# Patient Record
Sex: Male | Born: 1955
Health system: Southern US, Community
[De-identification: ages and names within clinical notes are randomized; demographics above are authoritative.]

## PROBLEM LIST (undated history)

## (undated) DIAGNOSIS — I509 Heart failure, unspecified: Secondary | ICD-10-CM

## (undated) DIAGNOSIS — E119 Type 2 diabetes mellitus without complications: Secondary | ICD-10-CM

## (undated) DIAGNOSIS — I10 Essential (primary) hypertension: Secondary | ICD-10-CM

## (undated) HISTORY — PX: CORONARY ARTERY BYPASS GRAFT: SHX141

## (undated) HISTORY — PX: APPENDECTOMY: SHX54

---

## 2006-07-25 ENCOUNTER — Inpatient Hospital Stay: Payer: Self-pay | Admitting: Internal Medicine

## 2006-07-25 ENCOUNTER — Other Ambulatory Visit: Payer: Self-pay

## 2008-04-22 ENCOUNTER — Inpatient Hospital Stay: Payer: Self-pay | Admitting: Specialist

## 2013-03-28 ENCOUNTER — Emergency Department: Payer: Self-pay | Admitting: Emergency Medicine

## 2013-03-28 LAB — CBC
HCT: 39.1 % — AB (ref 40.0–52.0)
HGB: 12.9 g/dL — AB (ref 13.0–18.0)
MCH: 29 pg (ref 26.0–34.0)
MCHC: 33.1 g/dL (ref 32.0–36.0)
MCV: 88 fL (ref 80–100)
Platelet: 233 10*3/uL (ref 150–440)
RBC: 4.46 10*6/uL (ref 4.40–5.90)
RDW: 13.4 % (ref 11.5–14.5)
WBC: 9.2 10*3/uL (ref 3.8–10.6)

## 2013-03-28 LAB — BASIC METABOLIC PANEL
Anion Gap: 6 — ABNORMAL LOW (ref 7–16)
BUN: 36 mg/dL — ABNORMAL HIGH (ref 7–18)
CREATININE: 1.39 mg/dL — AB (ref 0.60–1.30)
Calcium, Total: 9.4 mg/dL (ref 8.5–10.1)
Chloride: 102 mmol/L (ref 98–107)
Co2: 28 mmol/L (ref 21–32)
EGFR (African American): >60
EGFR (Non-African Amer.): 55 — ABNORMAL LOW
Glucose: 88 mg/dL (ref 65–99)
OSMOLALITY: 280 (ref 275–301)
Potassium: 4.1 mmol/L (ref 3.5–5.1)
Sodium: 136 mmol/L (ref 136–145)

## 2013-03-28 LAB — TROPONIN I: Troponin-I: <0.02 ng/mL

## 2013-04-09 ENCOUNTER — Ambulatory Visit: Payer: Self-pay | Admitting: Cardiology

## 2013-06-02 DIAGNOSIS — I5022 Chronic systolic (congestive) heart failure: Secondary | ICD-10-CM | POA: Insufficient documentation

## 2013-06-02 DIAGNOSIS — I10 Essential (primary) hypertension: Secondary | ICD-10-CM | POA: Insufficient documentation

## 2013-06-02 DIAGNOSIS — E119 Type 2 diabetes mellitus without complications: Secondary | ICD-10-CM | POA: Insufficient documentation

## 2013-06-02 DIAGNOSIS — I25810 Atherosclerosis of coronary artery bypass graft(s) without angina pectoris: Secondary | ICD-10-CM | POA: Insufficient documentation

## 2013-06-02 DIAGNOSIS — I2510 Atherosclerotic heart disease of native coronary artery without angina pectoris: Secondary | ICD-10-CM | POA: Insufficient documentation

## 2013-06-02 DIAGNOSIS — I152 Hypertension secondary to endocrine disorders: Secondary | ICD-10-CM | POA: Insufficient documentation

## 2013-12-24 DIAGNOSIS — I4510 Unspecified right bundle-branch block: Secondary | ICD-10-CM | POA: Insufficient documentation

## 2014-01-17 DIAGNOSIS — E119 Type 2 diabetes mellitus without complications: Secondary | ICD-10-CM | POA: Insufficient documentation

## 2015-02-05 DIAGNOSIS — M109 Gout, unspecified: Secondary | ICD-10-CM | POA: Insufficient documentation

## 2015-05-09 DIAGNOSIS — H5440 Blindness, one eye, unspecified eye: Secondary | ICD-10-CM | POA: Insufficient documentation

## 2015-08-15 DIAGNOSIS — E782 Mixed hyperlipidemia: Secondary | ICD-10-CM | POA: Insufficient documentation

## 2015-08-15 DIAGNOSIS — E1169 Type 2 diabetes mellitus with other specified complication: Secondary | ICD-10-CM | POA: Insufficient documentation

## 2018-04-11 ENCOUNTER — Other Ambulatory Visit: Payer: Self-pay | Admitting: Cardiology

## 2018-04-11 ENCOUNTER — Other Ambulatory Visit: Payer: Self-pay

## 2018-04-11 DIAGNOSIS — R0789 Other chest pain: Secondary | ICD-10-CM

## 2018-04-13 ENCOUNTER — Encounter (INDEPENDENT_AMBULATORY_CARE_PROVIDER_SITE_OTHER): Payer: Self-pay

## 2018-04-13 ENCOUNTER — Ambulatory Visit
Admission: RE | Admit: 2018-04-13 | Discharge: 2018-04-13 | Disposition: A | Discharge status: Home / Self Care | Payer: Commercial Managed Care - PPO | Source: Ambulatory Visit | Attending: Cardiology | Admitting: Cardiology

## 2018-04-13 DIAGNOSIS — R0789 Other chest pain: Secondary | ICD-10-CM | POA: Diagnosis present

## 2018-04-13 HISTORY — DX: Essential (primary) hypertension: I10

## 2018-04-13 HISTORY — DX: Heart failure, unspecified: I50.9

## 2018-04-13 MED ORDER — IOPAMIDOL (ISOVUE-370) INJECTION 76%
100.0000 mL | Freq: Once | INTRAVENOUS | Status: AC | PRN
  Administered 2018-04-13: 100 mL via INTRAVENOUS

## 2018-04-17 ENCOUNTER — Other Ambulatory Visit: Payer: Self-pay

## 2018-04-17 ENCOUNTER — Encounter
Admission: RE | Disposition: A | Discharge status: Home / Self Care | Payer: Self-pay | Source: Home / Self Care | Attending: Cardiology

## 2018-04-17 ENCOUNTER — Ambulatory Visit
Admission: RE | Admit: 2018-04-17 | Discharge: 2018-04-17 | Disposition: A | Discharge status: Home / Self Care | Payer: Commercial Managed Care - PPO | Attending: Cardiology | Admitting: Cardiology

## 2018-04-17 DIAGNOSIS — I25810 Atherosclerosis of coronary artery bypass graft(s) without angina pectoris: Secondary | ICD-10-CM | POA: Diagnosis not present

## 2018-04-17 DIAGNOSIS — Z87891 Personal history of nicotine dependence: Secondary | ICD-10-CM | POA: Insufficient documentation

## 2018-04-17 DIAGNOSIS — E785 Hyperlipidemia, unspecified: Secondary | ICD-10-CM | POA: Insufficient documentation

## 2018-04-17 DIAGNOSIS — I509 Heart failure, unspecified: Secondary | ICD-10-CM | POA: Diagnosis not present

## 2018-04-17 DIAGNOSIS — Z79899 Other long term (current) drug therapy: Secondary | ICD-10-CM | POA: Insufficient documentation

## 2018-04-17 DIAGNOSIS — Z7984 Long term (current) use of oral hypoglycemic drugs: Secondary | ICD-10-CM | POA: Insufficient documentation

## 2018-04-17 DIAGNOSIS — I4510 Unspecified right bundle-branch block: Secondary | ICD-10-CM | POA: Diagnosis not present

## 2018-04-17 DIAGNOSIS — I110 Hypertensive heart disease with heart failure: Secondary | ICD-10-CM | POA: Insufficient documentation

## 2018-04-17 DIAGNOSIS — Z8673 Personal history of transient ischemic attack (TIA), and cerebral infarction without residual deficits: Secondary | ICD-10-CM | POA: Diagnosis not present

## 2018-04-17 DIAGNOSIS — E119 Type 2 diabetes mellitus without complications: Secondary | ICD-10-CM | POA: Diagnosis not present

## 2018-04-17 DIAGNOSIS — Z7982 Long term (current) use of aspirin: Secondary | ICD-10-CM | POA: Insufficient documentation

## 2018-04-17 DIAGNOSIS — R079 Chest pain, unspecified: Secondary | ICD-10-CM | POA: Insufficient documentation

## 2018-04-17 DIAGNOSIS — M109 Gout, unspecified: Secondary | ICD-10-CM | POA: Diagnosis not present

## 2018-04-17 HISTORY — PX: LEFT HEART CATH AND CORS/GRAFTS ANGIOGRAPHY: CATH118250

## 2018-04-17 LAB — GLUCOSE, CAPILLARY: Glucose-Capillary: 141 mg/dL — ABNORMAL HIGH (ref 70–99)

## 2018-04-17 SURGERY — LEFT HEART CATH AND CORS/GRAFTS ANGIOGRAPHY
Anesthesia: Moderate Sedation

## 2018-04-17 MED ORDER — MIDAZOLAM HCL 2 MG/2ML IJ SOLN
INTRAMUSCULAR | Status: AC
  Filled 2018-04-17: qty 2

## 2018-04-17 MED ORDER — HEPARIN (PORCINE) IN NACL 1000-0.9 UT/500ML-% IV SOLN
INTRAVENOUS | Status: DC | PRN
  Administered 2018-04-17: 500 mL

## 2018-04-17 MED ORDER — ASPIRIN 81 MG PO CHEW
CHEWABLE_TABLET | ORAL | Status: AC
  Administered 2018-04-17: 81 mg via ORAL
  Filled 2018-04-17: qty 1

## 2018-04-17 MED ORDER — NITROGLYCERIN 2 % TD OINT
1.0000 [in_us] | TOPICAL_OINTMENT | Freq: Once | TRANSDERMAL | Status: AC
  Administered 2018-04-17: 1 [in_us] via TOPICAL

## 2018-04-17 MED ORDER — IOPAMIDOL (ISOVUE-300) INJECTION 61%
INTRAVENOUS | Status: DC | PRN
  Administered 2018-04-17: 65 mL via INTRA_ARTERIAL

## 2018-04-17 MED ORDER — HEPARIN (PORCINE) IN NACL 1000-0.9 UT/500ML-% IV SOLN
INTRAVENOUS | Status: AC
  Filled 2018-04-17: qty 1000

## 2018-04-17 MED ORDER — ASPIRIN 81 MG PO CHEW
81.0000 mg | CHEWABLE_TABLET | ORAL | Status: AC
  Administered 2018-04-17: 81 mg via ORAL

## 2018-04-17 MED ORDER — NITROGLYCERIN 0.4 MG SL SUBL
SUBLINGUAL_TABLET | SUBLINGUAL | Status: AC
  Filled 2018-04-17: qty 1

## 2018-04-17 MED ORDER — SODIUM CHLORIDE 0.9% FLUSH: 3.0000 mL | INTRAVENOUS | Status: DC | PRN

## 2018-04-17 MED ORDER — SODIUM CHLORIDE 0.9 % WEIGHT BASED INFUSION
3.0000 mL/kg/h | INTRAVENOUS | Status: AC
  Administered 2018-04-17: 1.495 mL/kg/h via INTRAVENOUS

## 2018-04-17 MED ORDER — FENTANYL CITRATE (PF) 100 MCG/2ML IJ SOLN
INTRAMUSCULAR | Status: AC
  Filled 2018-04-17: qty 2

## 2018-04-17 MED ORDER — SODIUM CHLORIDE 0.9 % IV SOLN: 250.0000 mL | INTRAVENOUS | Status: DC | PRN

## 2018-04-17 MED ORDER — NITROGLYCERIN 0.4 MG SL SUBL
0.4000 mg | SUBLINGUAL_TABLET | SUBLINGUAL | Status: DC | PRN
  Administered 2018-04-17: 0.4 mg via SUBLINGUAL

## 2018-04-17 MED ORDER — FENTANYL CITRATE (PF) 100 MCG/2ML IJ SOLN
INTRAMUSCULAR | Status: DC | PRN
  Administered 2018-04-17: 50 ug via INTRAVENOUS
  Administered 2018-04-17: 25 ug via INTRAVENOUS

## 2018-04-17 MED ORDER — MIDAZOLAM HCL 2 MG/2ML IJ SOLN
INTRAMUSCULAR | Status: DC | PRN
  Administered 2018-04-17 (×2): 1 mg via INTRAVENOUS

## 2018-04-17 MED ORDER — SODIUM CHLORIDE 0.9 % WEIGHT BASED INFUSION
1.0000 mL/kg/h | INTRAVENOUS | Status: DC
  Administered 2018-04-17: 1 mL/kg/h via INTRAVENOUS

## 2018-04-17 MED ORDER — SODIUM CHLORIDE 0.9% FLUSH: 3.0000 mL | Freq: Two times a day (BID) | INTRAVENOUS | Status: DC

## 2018-04-17 MED ORDER — NITROGLYCERIN 2 % TD OINT
TOPICAL_OINTMENT | TRANSDERMAL | Status: AC
  Administered 2018-04-17: 1 [in_us] via TOPICAL
  Filled 2018-04-17: qty 1

## 2018-04-17 SURGICAL SUPPLY — 11 items
CATH INFINITI 5FR ANG PIGTAIL (CATHETERS) ×4 IMPLANT
CATH INFINITI 5FR JL4 (CATHETERS) ×4 IMPLANT
CATH INFINITI JR4 5F (CATHETERS) ×4 IMPLANT
DEVICE CLOSURE MYNXGRIP 5F (Vascular Products) ×4 IMPLANT
GLIDESHEATH SLEND A-KIT 6F 22G (SHEATH) IMPLANT
KIT MANI 3VAL PERCEP (MISCELLANEOUS) ×4 IMPLANT
NEEDLE PERC 18GX7CM (NEEDLE) ×4 IMPLANT
PACK CARDIAC CATH (CUSTOM PROCEDURE TRAY) ×4 IMPLANT
SHEATH AVANTI 5FR X 11CM (SHEATH) ×4 IMPLANT
WIRE GUIDERIGHT .035X150 (WIRE) ×4 IMPLANT
WIRE ROSEN-J .035X260CM (WIRE) IMPLANT

## 2018-04-17 NOTE — Discharge Instructions (Signed)
Isosorbide Dinitrate, ISDN extended-release tablets or capsules What is this medicine? ISOSORBIDE DINITRATE (eye soe SOR bide dye NYE trate) is a type of vasodilator. It relaxes blood vessels, increasing the blood and oxygen supply to your heart. This medicine is used to prevent chest pain caused by angina. It will not help to stop an episode of chest pain. This medicine may be used for other purposes; ask your health care provider or pharmacist if you have questions. COMMON BRAND NAME(S): Dilatrate SR, Isochron, IsoDitrate What should I tell my health care provider before I take this medicine? They need to know if you have any of these conditions: -previous heart attack or heart failure -an unusual or allergic reaction to isosorbide dinitrate, nitrates, other medicines, foods, dyes, or preservatives -pregnant or trying to get pregnant -breast-feeding How should I use this medicine? Take this medicine by mouth with a glass of water. Follow the directions on the prescription label. Swallow whole. Do not crush or chew. Take your medicine at regular intervals. Do not take your medicine more often than directed. Do not stop taking this medicine suddenly or your symptoms may get worse. Ask your doctor or health care professional how to gradually reduce the dose. Talk to your pediatrician regarding the use of this medicine in children. Special care may be needed. Overdosage: If you think you have taken too much of this medicine contact a poison control center or emergency room at once. NOTE: This medicine is only for you. Do not share this medicine with others. What if I miss a dose? If you miss a dose, take it as soon as you can. If it is almost time for your next dose, take only that dose. Do not take double or extra doses. What may interact with this medicine? Do not take this medicine with any of the following medications: -medicines used to treat erectile dysfunction (ED) like avanafil,  sildenafil, tadalafil, and vardenafil -riociguat This medicine may also interact with the following medications: -medicines for high blood pressure -other medicines for angina or heart failure This list may not describe all possible interactions. Give your health care provider a list of all the medicines, herbs, non-prescription drugs, or dietary supplements you use. Also tell them if you smoke, drink alcohol, or use illegal drugs. Some items may interact with your medicine. What should I watch for while using this medicine? Check your heart rate and blood pressure regularly while you are taking this medicine. Ask your doctor or health care professional what your heart rate and blood pressure should be and when you should contact him or her. Tell your doctor or health care professional if you feel your medicine is no longer working. You may get dizzy. Do not drive, use machinery, or do anything that needs mental alertness until you know how this medicine affects you. To reduce the risk of dizzy or fainting spells, do not sit or stand up quickly, especially if you are an older patient. Alcohol can make you more dizzy, and increase flushing and rapid heartbeats. Avoid alcoholic drinks. Do not treat yourself for coughs, colds, or pain while you are taking this medicine without asking your doctor or health care professional for advice. Some ingredients may increase your blood pressure. What side effects may I notice from receiving this medicine? Side effects that you should report to your doctor or health care professional as soon as possible: -bluish discoloration of lips, fingernails, or palms of hands -irregular heartbeat, palpitations -low blood pressure -nausea, vomiting -  persistent headache -unusually weak or tired Side effects that usually do not require medical attention (report to your doctor or health care professional if they continue or are bothersome): -flushing of the face or  neck -rash This list may not describe all possible side effects. Call your doctor for medical advice about side effects. You may report side effects to FDA at 1-800-FDA-1088. Where should I keep my medicine? Keep out of the reach of children. Store at room temperature between 15 and 30 degrees C (59 and 86 degrees F). Protect from moisture. Throw away any unused medicine after the expiration date. NOTE: This sheet is a summary. It may not cover all possible information. If you have questions about this medicine, talk to your doctor, pharmacist, or health care provider.  2019 Elsevier/Gold Standard (2012-12-22 14:46:07) Angiogram, Care After This sheet gives you information about how to care for yourself after your procedure. Your doctor may also give you more specific instructions. If you have problems or questions, contact your doctor. Follow these instructions at home: Insertion site care  Follow instructions from your doctor about how to take care of your long, thin tube (catheter) insertion area. Make sure you: ? Wash your hands with soap and water before you change your bandage (dressing). If you cannot use soap and water, use hand sanitizer. ? Change your bandage as told by your doctor. ? Leave stitches (sutures), skin glue, or skin tape (adhesive) strips in place. They may need to stay in place for 2 weeks or longer. If tape strips get loose and curl up, you may trim the loose edges. Do not remove tape strips completely unless your doctor says it is okay.  Do not take baths, swim, or use a hot tub until your doctor says it is okay.  You may shower 24-48 hours after the procedure or as told by your doctor. ? Gently wash the area with plain soap and water. ? Pat the area dry with a clean towel. ? Do not rub the area. This may cause bleeding.  Do not apply powder or lotion to the area. Keep the area clean and dry.  Check your insertion area every day for signs of infection. Check  for: ? More redness, swelling, or pain. ? Fluid or blood. ? Warmth. ? Pus or a bad smell. Activity  Rest as told by your doctor, usually for 1-2 days.  Do not lift anything that is heavier than 10 lbs. (4.5 kg) or as told by your doctor.  Do not drive for 24 hours if you were given a medicine to help you relax (sedative).  Do not drive or use heavy machinery while taking prescription pain medicine. General instructions   Go back to your normal activities as told by your doctor, usually in about a week. Ask your doctor what activities are safe for you.  If the insertion area starts to bleed, lie flat and put pressure on the area. If the bleeding does not stop, get help right away. This is an emergency.  Drink enough fluid to keep your pee (urine) clear or pale yellow.  Take over-the-counter and prescription medicines only as told by your doctor.  Keep all follow-up visits as told by your doctor. This is important. Contact a doctor if:  You have a fever.  You have chills.  You have more redness, swelling, or pain around your insertion area.  You have fluid or blood coming from your insertion area.  The insertion area feels warm  to the touch.  You have pus or a bad smell coming from your insertion area.  You have more bruising around the insertion area.  Blood collects in the tissue around the insertion area (hematoma) that may be painful to the touch. Get help right away if:  You have a lot of pain in the insertion area.  The insertion area swells very fast.  The insertion area is bleeding, and the bleeding does not stop after holding steady pressure on the area.  The area near or just beyond the insertion area becomes pale, cool, tingly, or numb. These symptoms may be an emergency. Do not wait to see if the symptoms will go away. Get medical help right away. Call your local emergency services (911 in the [place] Do not drive yourself to the  hospital. Summary  After the procedure, it is common to have bruising and tenderness at the long, thin tube insertion area.  After the procedure, it is important to rest and drink plenty of fluids.  Do not take baths, swim, or use a hot tub until your doctor says it is okay to do so. You may shower 24-48 hours after the procedure or as told by your doctor.  If the insertion area starts to bleed, lie flat and put pressure on the area. If the bleeding does not stop, get help right away. This is an emergency. This information is not intended to replace advice given to you by your health care provider. Make sure you discuss any questions you have with your health care provider. Document Released: 05/21/2008 Document Revised: 02/17/2016 Document Reviewed: 02/17/2016 Elsevier Interactive Patient Education  2019 Elsevier Inc. Moderate Conscious Sedation, Adult, Care After These instructions provide you with information about caring for yourself after your procedure. Your health care provider may also give you more specific instructions. Your treatment has been planned according to current medical practices, but problems sometimes occur. Call your health care provider if you have any problems or questions after your procedure. What can I expect after the procedure? After your procedure, it is common:  To feel sleepy for several hours.  To feel clumsy and have poor balance for several hours.  To have poor judgment for several hours.  To vomit if you eat too soon. Follow these instructions at home: For at least 24 hours after the procedure:   Do not: ? Participate in activities where you could fall or become injured. ? Drive. ? Use heavy machinery. ? Drink alcohol. ? Take sleeping pills or medicines that cause drowsiness. ? Make important decisions or sign legal documents. ? Take care of children on your own.  Rest. Eating and drinking  Follow the diet recommended by your health care  provider.  If you vomit: ? Drink water, juice, or soup when you can drink without vomiting. ? Make sure you have little or no nausea before eating solid foods. General instructions  Have a responsible adult stay with you until you are awake and alert.  Take over-the-counter and prescription medicines only as told by your health care provider.  If you smoke, do not smoke without supervision.  Keep all follow-up visits as told by your health care provider. This is important. Contact a health care provider if:  You keep feeling nauseous or you keep vomiting.  You feel light-headed.  You develop a rash.  You have a fever. Get help right away if:  You have trouble breathing. This information is not intended to replace advice given  to you by your health care provider. Make sure you discuss any questions you have with your health care provider. Document Released: 12/13/2012 Document Revised: 07/28/2015 Document Reviewed: 06/14/2015 Elsevier Interactive Patient Education  2019 [employer]

## 2018-05-01 ENCOUNTER — Other Ambulatory Visit (INDEPENDENT_AMBULATORY_CARE_PROVIDER_SITE_OTHER): Payer: Self-pay | Admitting: Cardiology

## 2018-05-01 ENCOUNTER — Ambulatory Visit (INDEPENDENT_AMBULATORY_CARE_PROVIDER_SITE_OTHER): Payer: Commercial Managed Care - PPO

## 2018-05-01 DIAGNOSIS — I25810 Atherosclerosis of coronary artery bypass graft(s) without angina pectoris: Secondary | ICD-10-CM

## 2018-05-30 DIAGNOSIS — R079 Chest pain, unspecified: Secondary | ICD-10-CM | POA: Insufficient documentation

## 2018-11-28 ENCOUNTER — Ambulatory Visit (INDEPENDENT_AMBULATORY_CARE_PROVIDER_SITE_OTHER): Payer: Commercial Managed Care - PPO | Admitting: Vascular Surgery

## 2018-11-28 ENCOUNTER — Encounter (INDEPENDENT_AMBULATORY_CARE_PROVIDER_SITE_OTHER): Payer: Self-pay | Admitting: Vascular Surgery

## 2018-11-28 ENCOUNTER — Encounter (INDEPENDENT_AMBULATORY_CARE_PROVIDER_SITE_OTHER): Payer: Self-pay

## 2018-11-28 ENCOUNTER — Other Ambulatory Visit: Payer: Self-pay

## 2018-11-28 VITALS — BP 136/76 | HR 76 | Resp 14 | Ht 67.0 in | Wt 174.0 lb

## 2018-11-28 DIAGNOSIS — I25810 Atherosclerosis of coronary artery bypass graft(s) without angina pectoris: Secondary | ICD-10-CM

## 2018-11-28 DIAGNOSIS — I10 Essential (primary) hypertension: Secondary | ICD-10-CM | POA: Diagnosis not present

## 2018-11-28 DIAGNOSIS — E785 Hyperlipidemia, unspecified: Secondary | ICD-10-CM

## 2018-11-28 DIAGNOSIS — I70219 Atherosclerosis of native arteries of extremities with intermittent claudication, unspecified extremity: Secondary | ICD-10-CM | POA: Insufficient documentation

## 2018-11-28 DIAGNOSIS — I739 Peripheral vascular disease, unspecified: Secondary | ICD-10-CM | POA: Insufficient documentation

## 2018-11-28 DIAGNOSIS — I70213 Atherosclerosis of native arteries of extremities with intermittent claudication, bilateral legs: Secondary | ICD-10-CM

## 2018-11-28 NOTE — Assessment & Plan Note (Signed)
His cardiologist ordered noninvasive studies which showed poor waveforms distally with ABIs that were in the 0.8 range at rest.  Recommend:  The patient has experienced increased symptoms and is now describing lifestyle limiting claudication and mild rest pain.  Both legs are affected he understands that these may need to be treated in a staged fashion.   Given the severity of the patient's lower extremity symptoms the patient should undergo angiography and intervention.  Risk and benefits were reviewed the patient.  Indications for the procedure were reviewed.  All questions were answered, the patient agrees to proceed.   The patient should continue walking and begin a more formal exercise program.  The patient should continue antiplatelet therapy and aggressive treatment of the lipid abnormalities  The patient will follow up with me after the angiogram.

## 2018-11-28 NOTE — Assessment & Plan Note (Signed)
lipid control important in reducing the progression of atherosclerotic disease. Continue statin therapy  

## 2018-11-28 NOTE — Assessment & Plan Note (Signed)
blood pressure control important in reducing the progression of atherosclerotic disease. On appropriate oral medications.  

## 2018-11-28 NOTE — Patient Instructions (Signed)

## 2018-11-28 NOTE — Progress Notes (Signed)
Patient ID: Francisco Medina, male   DOB: [birth year], 63 y.o.   MRN: [id number]  Chief Complaint  Patient presents with   New Patient (Initial Visit)    HPI Francisco Medina is a 63 y.o. male.  I am asked to see the patient by Dr. Lady Gary for evaluation of leg pain and possible claudication. He has previously had a lab only venous study in our office, but has not been seen by a provider that I can tell. Both legs are very painful with activity.  This is gradually worsened over the last year or so.  He can walk about 100 feet before his legs are in debilitating pain and he has to stop and rest.  This hurts all the way from his hips down to his feet.  This is mostly a cramping and aching in the muscles.  The more he tries to use them, the worst the pain gets.  He has stopped smoking.  He is already on aspirin, Plavix, and a statin agent.  No open wounds or infection.  His cardiologist ordered noninvasive studies which showed poor waveforms distally with ABIs that were in the 0.8 range at rest.  With these findings he is referred for further evaluation and treatment.     Past Medical History:  Diagnosis Date   CHF (congestive heart failure) (HCC)    Hypertension     Past Surgical History:  Procedure Laterality Date   LEFT HEART CATH AND CORS/GRAFTS ANGIOGRAPHY N/A 04/17/2018   Procedure: LEFT HEART CATH AND CORS/GRAFTS ANGIOGRAPHY;  Surgeon: Dalia Heading, MD;  Location: [facility] INVASIVE CV LAB;  Service: Cardiovascular;  Laterality: N/A;    Family History No bleeding disorders, clotting disorders, autoimmune diseases, or aneurysms   Social History Social History   Tobacco Use   Smoking status: Former Smoker    Packs/day: 1.00    Years: 10.00    Pack years: 10.00   Smokeless tobacco: Never Used  Substance Use Topics   Alcohol use: Not Currently   Drug use: Not Currently  Married and has to care for his wife who has stage IV cancer  Allergies  Allergen Reactions     Atorvastatin     Other reaction(s): Muscle Pain    Current Outpatient Medications  Medication Sig Dispense Refill   aspirin 325 MG tablet Take 325 mg by mouth at bedtime.     atorvastatin (LIPITOR) 80 MG tablet Take 80 mg by mouth daily.     carvedilol (COREG) 6.25 MG tablet Take 6.25 mg by mouth 2 (two) times daily.     clopidogrel (PLAVIX) 75 MG tablet Take 75 mg by mouth daily.     colchicine 0.6 MG tablet Take 0.6 mg by mouth daily as needed (gout).      fluticasone (FLONASE) 50 MCG/ACT nasal spray Place 2 sprays into both nostrils daily as needed for allergies.     furosemide (LASIX) 40 MG tablet Take 40 mg by mouth daily.     glipiZIDE (GLUCOTROL XL) 5 MG 24 hr tablet Take 5 mg by mouth daily.     hydrochlorothiazide (HYDRODIURIL) 25 MG tablet Take 25 mg by mouth daily.     isosorbide mononitrate (IMDUR) 30 MG 24 hr tablet Take 30 mg by mouth daily.     latanoprost (XALATAN) 0.005 % ophthalmic solution Place 1 drop into both eyes at bedtime.     losartan (COZAAR) 100 MG tablet Take 100 mg by mouth daily.  metFORMIN (GLUCOPHAGE) 1000 MG tablet Take 1,000 mg by mouth 2 (two) times daily with a meal.     nitroGLYCERIN (NITROSTAT) 0.4 MG SL tablet Place 0.4 mg under the tongue every 5 (five) minutes as needed for chest pain.      potassium chloride (K-DUR) 10 MEQ tablet Take 10 mEq by mouth daily.     timolol (TIMOPTIC) 0.5 % ophthalmic solution Place 1 drop into both eyes every morning.     No current facility-administered medications for this visit.       REVIEW OF SYSTEMS (Negative unless checked)  Constitutional: [] Weight loss  [] Fever  [] Chills Cardiac: [] Chest pain   [] Chest pressure   [] Palpitations   [] Shortness of breath when laying flat   [] Shortness of breath at rest   [x] Shortness of breath with exertion. Vascular:  [x] Pain in legs with walking   [] Pain in legs at rest   [] Pain in legs when laying flat   [x] Claudication   [] Pain in feet when  walking  [] Pain in feet at rest  [] Pain in feet when laying flat   [] History of DVT   [] Phlebitis   [] Swelling in legs   [] Varicose veins   [] Non-healing ulcers Pulmonary:   [] Uses home oxygen   [] Productive cough   [] Hemoptysis   [] Wheeze  [] COPD   [] Asthma Neurologic:  [] Dizziness  [] Blackouts   [] Seizures   [] History of stroke   [] History of TIA  [] Aphasia   [] Temporary blindness   [] Dysphagia   [] Weakness or numbness in arms   [] Weakness or numbness in legs Musculoskeletal:  [x] Arthritis   [] Joint swelling   [] Joint pain   [] Low back pain Hematologic:  [] Easy bruising  [] Easy bleeding   [] Hypercoagulable state   [] Anemic  [] Hepatitis Gastrointestinal:  [] Blood in stool   [] Vomiting blood  [] Gastroesophageal reflux/heartburn   [] Abdominal pain Genitourinary:  [] Chronic kidney disease   [] Difficult urination  [] Frequent urination  [] Burning with urination   [] Hematuria Skin:  [] Rashes   [] Ulcers   [] Wounds Psychological:  [] History of anxiety   []  History of major depression.    Physical Exam BP 136/76 (BP Location: Left Arm, Patient Position: Sitting, Cuff Size: Normal)    Pulse 76    Resp 14    Ht 5\' 7"  (1.702 m)    Wt 174 lb (78.9 kg)    BMI 27.25 kg/m  Gen:  WD/WN, NAD Head: [place]/AT, No temporalis wasting.  Ear/Nose/Throat: Hearing grossly intact, nares w/o erythema or drainage, oropharynx w/o Erythema/Exudate Eyes: Conjunctiva clear, sclera non-icteric  Neck: trachea midline.  No JVD.  Pulmonary:  Good air movement, respirations not labored, no use of accessory muscles  Cardiac: RRR, no JVD Vascular:  Vessel Right Left  Radial Palpable Palpable                          DP  1+  1+  PT  1+  1+   Gastrointestinal:. No masses, surgical incisions, or scars. Musculoskeletal: M/S 5/5 throughout.  Extremities without ischemic changes.  No deformity or atrophy.  No edema. Neurologic: Sensation grossly intact in extremities.  Symmetrical.  Speech is fluent. Motor exam as listed  above. Psychiatric: Judgment intact, Mood & affect appropriate for pt's clinical situation. Dermatologic: No rashes or ulcers noted.  No cellulitis or open wounds.    Radiology No results found.  Labs No results found for this or any previous visit (from the past 2160 hour(s)).  Assessment/Plan:  Benign essential  hypertension blood pressure control important in reducing the progression of atherosclerotic disease. On appropriate oral medications.   Mixed hyperlipidemia lipid control important in reducing the progression of atherosclerotic disease. Continue statin therapy   CAD (coronary artery disease), autologous vein bypass graft Apparently needs a second open heart surgery.  Had an original open heart surgery with right saphenous vein harvest over a decade ago  Atherosclerosis of native arteries of extremity with intermittent claudication [facility]) His cardiologist ordered noninvasive studies which showed poor waveforms distally with ABIs that were in the 0.8 range at rest.  Recommend:  The patient has experienced increased symptoms and is now describing lifestyle limiting claudication and mild rest pain.  Both legs are affected he understands that these may need to be treated in a staged fashion.   Given the severity of the patient's lower extremity symptoms the patient should undergo angiography and intervention.  Risk and benefits were reviewed the patient.  Indications for the procedure were reviewed.  All questions were answered, the patient agrees to proceed.   The patient should continue walking and begin a more formal exercise program.  The patient should continue antiplatelet therapy and aggressive treatment of the lipid abnormalities  The patient will follow up with me after the angiogram.       Leotis Pain 11/28/2018, 9:05 AM   This note was created with Dragon medical transcription system.  Any errors from dictation are unintentional.

## 2018-11-28 NOTE — Assessment & Plan Note (Signed)
Apparently needs a second open heart surgery.  Had an original open heart surgery with right saphenous vein harvest over a decade ago

## 2018-11-29 ENCOUNTER — Telehealth (INDEPENDENT_AMBULATORY_CARE_PROVIDER_SITE_OTHER): Payer: Self-pay

## 2018-11-29 NOTE — Telephone Encounter (Signed)
Spoke with the patient and he is now scheduled with Dr. Lucky Cowboy for leg angio's. Patient will do his right leg angio on 12/11/2018 with a 9:15 am arrival time to the [facility]. Patient will do his Covid testing on 12/07/2018 between 12:30-2:30 pm at the [place]. Patient will do his left leg angio on 12/18/2018 with a 6:45 am arrival time to the [facility]. Pre-procedure instructions were discussed and will be mailed to the patient.

## 2018-12-07 ENCOUNTER — Encounter (INDEPENDENT_AMBULATORY_CARE_PROVIDER_SITE_OTHER): Payer: Self-pay

## 2018-12-07 ENCOUNTER — Other Ambulatory Visit: Payer: Self-pay

## 2018-12-07 ENCOUNTER — Other Ambulatory Visit
Admission: RE | Admit: 2018-12-07 | Discharge: 2018-12-07 | Disposition: A | Discharge status: Home / Self Care | Payer: Commercial Managed Care - PPO | Source: Ambulatory Visit | Attending: Vascular Surgery | Admitting: Vascular Surgery

## 2018-12-07 DIAGNOSIS — Z20828 Contact with and (suspected) exposure to other viral communicable diseases: Secondary | ICD-10-CM | POA: Insufficient documentation

## 2018-12-07 NOTE — Telephone Encounter (Signed)
At the time the patient's left leg angio was scheduled the patient's wife called back wanting a later time in the day. Patient was scheduled with a 6:45 am arrival time to the [facility], I have moved the patient down and he now has a 8:45 am arrival time to the [facility]. New pre-procedure paperwork will be mailed out to the patient. I attempted to call the patient, a message for a return call was left.

## 2018-12-08 LAB — SARS CORONAVIRUS 2 (TAT 6-24 HRS): SARS Coronavirus 2: NEGATIVE

## 2018-12-10 ENCOUNTER — Other Ambulatory Visit (INDEPENDENT_AMBULATORY_CARE_PROVIDER_SITE_OTHER): Payer: Self-pay | Admitting: Nurse Practitioner

## 2018-12-11 ENCOUNTER — Other Ambulatory Visit: Payer: Self-pay

## 2018-12-11 ENCOUNTER — Encounter
Admission: RE | Disposition: A | Discharge status: Home / Self Care | Payer: Self-pay | Source: Home / Self Care | Attending: Vascular Surgery

## 2018-12-11 ENCOUNTER — Ambulatory Visit
Admission: RE | Admit: 2018-12-11 | Discharge: 2018-12-11 | Disposition: A | Discharge status: Home / Self Care | Payer: Commercial Managed Care - PPO | Attending: Vascular Surgery | Admitting: Vascular Surgery

## 2018-12-11 DIAGNOSIS — Z79899 Other long term (current) drug therapy: Secondary | ICD-10-CM | POA: Diagnosis not present

## 2018-12-11 DIAGNOSIS — Z7982 Long term (current) use of aspirin: Secondary | ICD-10-CM | POA: Diagnosis not present

## 2018-12-11 DIAGNOSIS — I70213 Atherosclerosis of native arteries of extremities with intermittent claudication, bilateral legs: Secondary | ICD-10-CM | POA: Diagnosis not present

## 2018-12-11 DIAGNOSIS — Z87891 Personal history of nicotine dependence: Secondary | ICD-10-CM | POA: Insufficient documentation

## 2018-12-11 DIAGNOSIS — I25810 Atherosclerosis of coronary artery bypass graft(s) without angina pectoris: Secondary | ICD-10-CM | POA: Diagnosis not present

## 2018-12-11 DIAGNOSIS — I509 Heart failure, unspecified: Secondary | ICD-10-CM | POA: Insufficient documentation

## 2018-12-11 DIAGNOSIS — E782 Mixed hyperlipidemia: Secondary | ICD-10-CM | POA: Insufficient documentation

## 2018-12-11 DIAGNOSIS — I70219 Atherosclerosis of native arteries of extremities with intermittent claudication, unspecified extremity: Secondary | ICD-10-CM

## 2018-12-11 DIAGNOSIS — I110 Hypertensive heart disease with heart failure: Secondary | ICD-10-CM | POA: Insufficient documentation

## 2018-12-11 DIAGNOSIS — Z7902 Long term (current) use of antithrombotics/antiplatelets: Secondary | ICD-10-CM | POA: Diagnosis not present

## 2018-12-11 HISTORY — PX: LOWER EXTREMITY ANGIOGRAPHY: CATH118251

## 2018-12-11 LAB — BUN: BUN: 16 mg/dL (ref 8–23)

## 2018-12-11 LAB — CREATININE, SERUM
Creatinine, Ser: 0.7 mg/dL (ref 0.61–1.24)
GFR calc Af Amer: >60 mL/min (ref >60)
GFR calc non Af Amer: >60 mL/min (ref >60)

## 2018-12-11 LAB — GLUCOSE, CAPILLARY: Glucose-Capillary: 163 mg/dL — ABNORMAL HIGH (ref 70–99)

## 2018-12-11 SURGERY — LOWER EXTREMITY ANGIOGRAPHY
Anesthesia: Moderate Sedation | Site: Leg Lower | Laterality: Right

## 2018-12-11 MED ORDER — MIDAZOLAM HCL 2 MG/2ML IJ SOLN
INTRAMUSCULAR | Status: DC | PRN
  Administered 2018-12-11: 1 mg via INTRAVENOUS
  Administered 2018-12-11: 2 mg via INTRAVENOUS

## 2018-12-11 MED ORDER — ONDANSETRON HCL 4 MG/2ML IJ SOLN: 4.0000 mg | Freq: Four times a day (QID) | INTRAMUSCULAR | Status: DC | PRN

## 2018-12-11 MED ORDER — SODIUM CHLORIDE 0.9 % IV SOLN
INTRAVENOUS | Status: DC
  Administered 2018-12-11: 10:00:00 via INTRAVENOUS

## 2018-12-11 MED ORDER — METHYLPREDNISOLONE SODIUM SUCC 125 MG IJ SOLR: 125.0000 mg | Freq: Once | INTRAMUSCULAR | Status: DC | PRN

## 2018-12-11 MED ORDER — CEFAZOLIN SODIUM-DEXTROSE 2-4 GM/100ML-% IV SOLN
2.0000 g | Freq: Once | INTRAVENOUS | Status: AC
  Administered 2018-12-11: 2 g via INTRAVENOUS

## 2018-12-11 MED ORDER — MIDAZOLAM HCL 2 MG/ML PO SYRP: 8.0000 mg | ORAL_SOLUTION | Freq: Once | ORAL | Status: DC | PRN

## 2018-12-11 MED ORDER — SODIUM CHLORIDE 0.9% FLUSH: 3.0000 mL | INTRAVENOUS | Status: DC | PRN

## 2018-12-11 MED ORDER — FENTANYL CITRATE (PF) 100 MCG/2ML IJ SOLN
INTRAMUSCULAR | Status: DC | PRN
  Administered 2018-12-11 (×2): 50 ug via INTRAVENOUS

## 2018-12-11 MED ORDER — NITROGLYCERIN 1 MG/10 ML FOR IR/CATH LAB
INTRA_ARTERIAL | Status: AC
  Filled 2018-12-11: qty 10

## 2018-12-11 MED ORDER — LABETALOL HCL 5 MG/ML IV SOLN: 10.0000 mg | INTRAVENOUS | Status: DC | PRN

## 2018-12-11 MED ORDER — IODIXANOL 320 MG/ML IV SOLN
INTRAVENOUS | Status: DC | PRN
  Administered 2018-12-11: 12:00:00 120 mL via INTRA_ARTERIAL

## 2018-12-11 MED ORDER — HEPARIN SODIUM (PORCINE) 1000 UNIT/ML IJ SOLN
INTRAMUSCULAR | Status: DC | PRN
  Administered 2018-12-11: 5000 [IU] via INTRAVENOUS

## 2018-12-11 MED ORDER — NITROGLYCERIN 1 MG/10 ML FOR IR/CATH LAB
INTRA_ARTERIAL | Status: DC | PRN
  Administered 2018-12-11: 250 ug

## 2018-12-11 MED ORDER — ACETAMINOPHEN 325 MG PO TABS: 650.0000 mg | ORAL_TABLET | ORAL | Status: DC | PRN

## 2018-12-11 MED ORDER — SODIUM CHLORIDE 0.9 % IV SOLN: 250.0000 mL | INTRAVENOUS | Status: DC | PRN

## 2018-12-11 MED ORDER — HYDROMORPHONE HCL 1 MG/ML IJ SOLN
1.0000 mg | Freq: Once | INTRAMUSCULAR | Status: AC | PRN
  Administered 2018-12-11: 13:00:00 1 mg via INTRAVENOUS

## 2018-12-11 MED ORDER — FENTANYL CITRATE (PF) 100 MCG/2ML IJ SOLN
INTRAMUSCULAR | Status: AC
  Filled 2018-12-11: qty 2

## 2018-12-11 MED ORDER — HYDRALAZINE HCL 20 MG/ML IJ SOLN: 5.0000 mg | INTRAMUSCULAR | Status: DC | PRN

## 2018-12-11 MED ORDER — MIDAZOLAM HCL 5 MG/5ML IJ SOLN
INTRAMUSCULAR | Status: AC
  Filled 2018-12-11: qty 5

## 2018-12-11 MED ORDER — SODIUM CHLORIDE 0.9% FLUSH: 3.0000 mL | Freq: Two times a day (BID) | INTRAVENOUS | Status: DC

## 2018-12-11 MED ORDER — FAMOTIDINE 20 MG PO TABS: 40.0000 mg | ORAL_TABLET | Freq: Once | ORAL | Status: DC | PRN

## 2018-12-11 MED ORDER — SODIUM CHLORIDE 0.9 % IV SOLN: INTRAVENOUS | Status: DC

## 2018-12-11 MED ORDER — HYDROMORPHONE HCL 1 MG/ML IJ SOLN
INTRAMUSCULAR | Status: AC
  Filled 2018-12-11: qty 1

## 2018-12-11 MED ORDER — HEPARIN SODIUM (PORCINE) 1000 UNIT/ML IJ SOLN
INTRAMUSCULAR | Status: AC
  Filled 2018-12-11: qty 1

## 2018-12-11 MED ORDER — DIPHENHYDRAMINE HCL 50 MG/ML IJ SOLN: 50.0000 mg | Freq: Once | INTRAMUSCULAR | Status: DC | PRN

## 2018-12-11 SURGICAL SUPPLY — 23 items
BALLN LUTONIX 018 4X100X130 (BALLOONS) ×3
BALLN LUTONIX 018 5X150X130 (BALLOONS) ×3
BALLN LUTONIX 018 5X300X130 (BALLOONS) ×3
BALLN LUTONIX 5X220X130 (BALLOONS) ×3
BALLN LUTONIX DCB 7X60X130 (BALLOONS) ×3
BALLOON LUTONIX 018 4X100X130 (BALLOONS) IMPLANT
BALLOON LUTONIX 018 5X150X130 (BALLOONS) IMPLANT
BALLOON LUTONIX 018 5X300X130 (BALLOONS) IMPLANT
BALLOON LUTONIX 5X220X130 (BALLOONS) IMPLANT
BALLOON LUTONIX DCB 7X60X130 (BALLOONS) IMPLANT
CATH BEACON 5 .038 100 VERT TP (CATHETERS) ×2 IMPLANT
CATH PIG 70CM (CATHETERS) ×2 IMPLANT
DEVICE PRESTO INFLATION (MISCELLANEOUS) ×2 IMPLANT
DEVICE STARCLOSE SE CLOSURE (Vascular Products) ×2 IMPLANT
GLIDEWIRE ADV .035X260CM (WIRE) ×2 IMPLANT
LIFESTENT SOLO 6X200X135 (Permanent Stent) ×2 IMPLANT
PACK ANGIOGRAPHY (CUSTOM PROCEDURE TRAY) ×3 IMPLANT
SHEATH ANL2 6FRX45 HC (SHEATH) ×4 IMPLANT
SHEATH BRITE TIP 5FRX11 (SHEATH) ×2 IMPLANT
SYR MEDRAD MARK 7 150ML (SYRINGE) ×2 IMPLANT
TUBING CONTRAST HIGH PRESS 72 (TUBING) ×2 IMPLANT
WIRE G V18X300CM (WIRE) ×2 IMPLANT
WIRE J 3MM .035X145CM (WIRE) ×2 IMPLANT

## 2018-12-11 NOTE — Op Note (Signed)
[facility]  Percutaneous Study/Intervention Procedural Note   Date of Surgery: 12/11/2018  Surgeon(s):Shaquala Broeker    Assistants:none  Pre-operative Diagnosis: PAD with claudication BLE  Post-operative diagnosis:  Same  Procedure(s) Performed:             1.  Ultrasound guidance for vascular access left femoral artery             2.  Catheter placement into right common femoral artery from left femoral approach             3.  Aortogram and selective right lower extremity angiogram             4.  Percutaneous transluminal angioplasty of left common iliac artery with 7 mm diameter by 6 cm length Lutonix drug-coated angioplasty balloon             5.   Percutaneous transluminal angioplasty of the right tibioperoneal trunk and proximal posterior tibial artery with 4 mm diameter by 10 cm length Lutonix drug-coated angioplasty balloon  6.  Percutaneous transluminal angioplasty of the right SFA and popliteal arteries with 5 mm diameter by 30 cm length and 5 mm diameter by 15 cm length Lutonix drug-coated angioplasty balloons  7.  Life stent placement to the right SFA and proximal popliteal artery with a 6 mm diameter by 20 cm length stent for areas of greater than 50% stenosis after angioplasty             8.  StarClose closure device left femoral artery  EBL: 15 cc  Contrast: 120 cc  Fluoro Time: 10.3 minutes  Moderate Conscious Sedation Time: approximately 45 minutes using 3 mg of Versed and 100 mcg of Fentanyl              Indications:  Patient is a 63 y.o.male with disabling claudication symptoms bilaterally. The patient has noninvasive study showing used ABIs bilaterally. The patient is brought in for angiography for further evaluation and potential treatment. Risks and benefits are discussed and informed consent is obtained.   Procedure:  The patient was identified and appropriate procedural time out was performed.  The patient was then placed supine on the  table and prepped and draped in the usual sterile fashion. Moderate conscious sedation was administered during a face to face encounter with the patient throughout the procedure with my supervision of the RN administering medicines and monitoring the patient's vital signs, pulse oximetry, telemetry and mental status throughout from the start of the procedure until the patient was taken to the recovery room. Ultrasound was used to evaluate the left common femoral artery.  It was patent .  A digital ultrasound image was acquired.  A Seldinger needle was used to access the left common femoral artery under direct ultrasound guidance and a permanent image was performed.  A 0.035 J wire was advanced without resistance and a 5Fr sheath was placed.  Pigtail catheter was placed into the aorta and an AP aortogram was performed. This demonstrated that the aorta was quite irregular and somewhat ectatic but not stenotic.  The renal arteries had good flow without hemodynamically significant stenosis.  The right iliac artery had only mild stenosis but the left common iliac artery had about an 80% stenosis just above the hypogastric artery and about a 65-70% stenosis closer to its origin.  The left external iliac artery distally also appeared to have a moderate stenosis this was just above the sheath access site and was a little  difficult to discern how bad this was. I then crossed the aortic bifurcation and advanced to the right femoral head. Selective right lower extremity angiogram was then performed. This demonstrated essentially no profunda femoris artery to speak of with only some small collaterals coming off of the common femoral artery and proximal SFA.  The SFA was diffusely diseased from the proximal SFA all the way down to the popliteal artery below the knee with multiple areas of greater than 75% stenosis with the tightest area being in the mid SFA in the above-knee popliteal artery being closer to 90% stenosis.  The  only tibial runoff was the posterior tibial artery and the tibioperoneal trunk and proximal posterior tibial artery had 70 to 80% stenosis.  The peroneal artery did reconstitute through collaterals distally but the anterior tibial artery was chronically occluded without distal reconstitution. It was felt that it was in the patient's best interest to proceed with intervention after these images to avoid a second procedure and a larger amount of contrast and fluoroscopy based off of the findings from the initial angiogram. The patient was systemically heparinized and prior to placing the long sheath in, a 7 mm diameter by 6 cm Lutonix drug-coated angioplasty balloon was used to treat the left common iliac artery lesions.  This was inflated to 12 atm and held for 1 minute.  Completion imaging still showed at least moderate stenosis near the origin of the left common iliac artery and the 50 to 60% range but the more distal left common iliac artery lesion was only about 20% residual stenosis.  I elected not to place a stent here today as this may require a kissing stent placement and he could complicate his subsequent left leg intervention and we could certainly consider addressing it at that time.  I then went ahead and proceeded to treat the right leg and a 6 [place] Ansell sheath was then placed over the [employer] wire. I then used a Kumpe catheter and the advantage wire to navigate through the SFA and popliteal lesions and get down into the distal popliteal artery where the tibioperoneal trunk lesion was better seen.  I then exchanged for a 0.018 wire and crossed this lesion without difficulty.  The tibioperoneal trunk and most proximal posterior tibial artery was treated with a 4 mm diameter by 10 cm length Lutonix drug-coated angioplasty balloon inflated to 10 atm for 1 minute.  To treat the whole of the popliteal artery and the SFA up to the proximal segment, a 5 mm diameter by 30 and a 5 mm diameter by 15  cm length Lutonix drug-coated angioplasty balloon were used.  The proximal inflation was 12 atm for 1 minute and the more distal inflation was 10 atm for 1 minute.  Completion imaging showed only about a 20 to 25% residual stenosis in the tibioperoneal trunk after the posterior tibial artery spasm was treated with intra-arterial nitroglycerin.  The proximal SFA and the popliteal artery lesions had less than 30% stenosis but the mid SFA and the most proximal popliteal artery had greater than 50% stenosis after angioplasty.  I elected to treat this area with a 6 mm diameter by 20 cm length life stent to avoid closing any collaterals since he had no significant profunda to speak of.  This was postdilated with a 5 mm balloon with less than 20% residual stenosis from the mid SFA down to the proximal popliteal artery. I elected to terminate the procedure. The sheath was removed and  StarClose closure device was deployed in the left femoral artery with excellent hemostatic result. The patient was taken to the recovery room in stable condition having tolerated the procedure well.  Findings:               Aortogram:  The aorta was quite irregular and somewhat ectatic but not stenotic.  The renal arteries had good flow without hemodynamically significant stenosis.  The right iliac artery had only mild stenosis but the left common iliac artery had about an 80% stenosis just above the hypogastric artery and about a 65-70% stenosis closer to its origin.  The left external iliac artery distally also appeared to have a moderate stenosis this was just above the sheath access site and was a little difficult to discern how bad this was.             Right Lower Extremity:  This demonstrated essentially no profunda femoris artery to speak of with only some small collaterals coming off of the common femoral artery and proximal SFA.  The SFA was diffusely diseased from the proximal SFA all the way down to the popliteal artery below  the knee with multiple areas of greater than 75% stenosis with the tightest area being in the mid SFA in the above-knee popliteal artery being closer to 90% stenosis.  The only tibial runoff was the posterior tibial artery and the tibioperoneal trunk and proximal posterior tibial artery had 70 to 80% stenosis.  The peroneal artery did reconstitute through collaterals distally but the anterior tibial artery was chronically occluded without distal reconstitution   Disposition: Patient was taken to the recovery room in stable condition having tolerated the procedure well.  Complications: None  Leotis Pain 12/11/2018 11:58 AM   This note was created with Dragon Medical transcription system. Any errors in dictation are purely unintentional.

## 2018-12-11 NOTE — Discharge Instructions (Signed)

## 2018-12-11 NOTE — H&P (Signed)
[facility] History & Physical Update  The patient was interviewed and re-examined.  The patient's previous History and Physical has been reviewed and is unchanged.  There is no change in the plan of care. We plan to proceed with the scheduled procedure.  Leotis Pain, MD  12/11/2018, 10:42 AM

## 2018-12-14 ENCOUNTER — Other Ambulatory Visit: Admission: RE | Admit: 2018-12-14 | Payer: Commercial Managed Care - PPO | Source: Ambulatory Visit

## 2018-12-17 ENCOUNTER — Other Ambulatory Visit (INDEPENDENT_AMBULATORY_CARE_PROVIDER_SITE_OTHER): Payer: Self-pay | Admitting: Nurse Practitioner

## 2018-12-18 ENCOUNTER — Other Ambulatory Visit: Payer: Self-pay

## 2018-12-18 ENCOUNTER — Encounter
Admission: RE | Disposition: A | Discharge status: Home / Self Care | Payer: Self-pay | Source: Home / Self Care | Attending: Vascular Surgery

## 2018-12-18 ENCOUNTER — Ambulatory Visit
Admission: RE | Admit: 2018-12-18 | Discharge: 2018-12-18 | Disposition: A | Discharge status: Home / Self Care | Payer: Commercial Managed Care - PPO | Attending: Vascular Surgery | Admitting: Vascular Surgery

## 2018-12-18 ENCOUNTER — Encounter: Payer: Self-pay | Admitting: *Deleted

## 2018-12-18 DIAGNOSIS — E782 Mixed hyperlipidemia: Secondary | ICD-10-CM | POA: Insufficient documentation

## 2018-12-18 DIAGNOSIS — I110 Hypertensive heart disease with heart failure: Secondary | ICD-10-CM | POA: Insufficient documentation

## 2018-12-18 DIAGNOSIS — Z87891 Personal history of nicotine dependence: Secondary | ICD-10-CM | POA: Insufficient documentation

## 2018-12-18 DIAGNOSIS — Z79899 Other long term (current) drug therapy: Secondary | ICD-10-CM | POA: Diagnosis not present

## 2018-12-18 DIAGNOSIS — Z7982 Long term (current) use of aspirin: Secondary | ICD-10-CM | POA: Diagnosis not present

## 2018-12-18 DIAGNOSIS — I25810 Atherosclerosis of coronary artery bypass graft(s) without angina pectoris: Secondary | ICD-10-CM | POA: Insufficient documentation

## 2018-12-18 DIAGNOSIS — I70213 Atherosclerosis of native arteries of extremities with intermittent claudication, bilateral legs: Secondary | ICD-10-CM | POA: Diagnosis not present

## 2018-12-18 DIAGNOSIS — I509 Heart failure, unspecified: Secondary | ICD-10-CM | POA: Insufficient documentation

## 2018-12-18 DIAGNOSIS — Z7902 Long term (current) use of antithrombotics/antiplatelets: Secondary | ICD-10-CM | POA: Insufficient documentation

## 2018-12-18 HISTORY — PX: LOWER EXTREMITY ANGIOGRAPHY: CATH118251

## 2018-12-18 LAB — CREATININE, SERUM
Creatinine, Ser: 0.86 mg/dL (ref 0.61–1.24)
GFR calc Af Amer: >60 mL/min (ref >60)
GFR calc non Af Amer: >60 mL/min (ref >60)

## 2018-12-18 LAB — BUN: BUN: 23 mg/dL (ref 8–23)

## 2018-12-18 LAB — GLUCOSE, CAPILLARY
Glucose-Capillary: 212 mg/dL — ABNORMAL HIGH (ref 70–99)
Glucose-Capillary: 134 mg/dL — ABNORMAL HIGH (ref 70–99)

## 2018-12-18 SURGERY — LOWER EXTREMITY ANGIOGRAPHY
Anesthesia: Moderate Sedation | Site: Leg Lower | Laterality: Left

## 2018-12-18 MED ORDER — FAMOTIDINE 20 MG PO TABS: 40.0000 mg | ORAL_TABLET | Freq: Once | ORAL | Status: DC | PRN

## 2018-12-18 MED ORDER — FENTANYL CITRATE (PF) 100 MCG/2ML IJ SOLN
INTRAMUSCULAR | Status: DC | PRN
  Administered 2018-12-18: 25 ug via INTRAVENOUS
  Administered 2018-12-18: 50 ug via INTRAVENOUS
  Administered 2018-12-18: 25 ug via INTRAVENOUS
  Administered 2018-12-18: 50 ug via INTRAVENOUS

## 2018-12-18 MED ORDER — HYDROMORPHONE HCL 1 MG/ML IJ SOLN
1.0000 mg | Freq: Once | INTRAMUSCULAR | Status: AC | PRN
  Administered 2018-12-18: 11:00:00 1 mg via INTRAVENOUS

## 2018-12-18 MED ORDER — MIDAZOLAM HCL 2 MG/2ML IJ SOLN
INTRAMUSCULAR | Status: AC
  Filled 2018-12-18: qty 2

## 2018-12-18 MED ORDER — MIDAZOLAM HCL 2 MG/2ML IJ SOLN
INTRAMUSCULAR | Status: DC | PRN
  Administered 2018-12-18 (×3): 1 mg via INTRAVENOUS
  Administered 2018-12-18: 2 mg via INTRAVENOUS

## 2018-12-18 MED ORDER — DIPHENHYDRAMINE HCL 50 MG/ML IJ SOLN: 50.0000 mg | Freq: Once | INTRAMUSCULAR | Status: DC | PRN

## 2018-12-18 MED ORDER — SODIUM CHLORIDE 0.9 % IV SOLN: INTRAVENOUS | Status: DC

## 2018-12-18 MED ORDER — MIDAZOLAM HCL 2 MG/ML PO SYRP: 8.0000 mg | ORAL_SOLUTION | Freq: Once | ORAL | Status: DC | PRN

## 2018-12-18 MED ORDER — HEPARIN SODIUM (PORCINE) 1000 UNIT/ML IJ SOLN
INTRAMUSCULAR | Status: AC
  Filled 2018-12-18: qty 1

## 2018-12-18 MED ORDER — FENTANYL CITRATE (PF) 100 MCG/2ML IJ SOLN
INTRAMUSCULAR | Status: AC
  Filled 2018-12-18: qty 2

## 2018-12-18 MED ORDER — IODIXANOL 320 MG/ML IV SOLN
INTRAVENOUS | Status: DC | PRN
  Administered 2018-12-18: 115 mL via INTRA_ARTERIAL

## 2018-12-18 MED ORDER — HEPARIN SODIUM (PORCINE) 1000 UNIT/ML IJ SOLN
INTRAMUSCULAR | Status: DC | PRN
  Administered 2018-12-18: 5000 [IU] via INTRAVENOUS

## 2018-12-18 MED ORDER — CEFAZOLIN SODIUM-DEXTROSE 2-4 GM/100ML-% IV SOLN
2.0000 g | Freq: Once | INTRAVENOUS | Status: AC
  Administered 2018-12-18: 2 g via INTRAVENOUS

## 2018-12-18 MED ORDER — HYDROMORPHONE HCL 1 MG/ML IJ SOLN
INTRAMUSCULAR | Status: AC
  Administered 2018-12-18: 1 mg via INTRAVENOUS
  Filled 2018-12-18: qty 0.5

## 2018-12-18 MED ORDER — MIDAZOLAM HCL 5 MG/5ML IJ SOLN
INTRAMUSCULAR | Status: AC
  Filled 2018-12-18: qty 5

## 2018-12-18 MED ORDER — METHYLPREDNISOLONE SODIUM SUCC 125 MG IJ SOLR: 125.0000 mg | Freq: Once | INTRAMUSCULAR | Status: DC | PRN

## 2018-12-18 MED ORDER — ONDANSETRON HCL 4 MG/2ML IJ SOLN: 4.0000 mg | Freq: Four times a day (QID) | INTRAMUSCULAR | Status: DC | PRN

## 2018-12-18 SURGICAL SUPPLY — 28 items
BALLN DORADO 8X40X80 (BALLOONS) ×3
BALLN LUTONIX 018 5X150X130 (BALLOONS) ×3
BALLN LUTONIX 018 5X300X130 (BALLOONS) ×3
BALLN LUTONIX 5X220X130 (BALLOONS) ×3
BALLN ULTRVRSE 3.5X100X150 (BALLOONS) ×3
BALLN ULTRVRSE 3X100X150 (BALLOONS) ×3
BALLOON DORADO 8X40X80 (BALLOONS) ×1 IMPLANT
BALLOON LUTONIX 018 5X150X130 (BALLOONS) ×1 IMPLANT
BALLOON LUTONIX 018 5X300X130 (BALLOONS) ×1 IMPLANT
BALLOON LUTONIX 5X220X130 (BALLOONS) ×1 IMPLANT
BALLOON ULTRVRSE 3.5X100X150 (BALLOONS) ×1 IMPLANT
BALLOON ULTRVRSE 3X100X150 (BALLOONS) ×1 IMPLANT
CATH BEACON 5 .038 100 VERT TP (CATHETERS) ×3 IMPLANT
CATH PIG 70CM (CATHETERS) ×3 IMPLANT
COVER PROBE U/S 5X48 (MISCELLANEOUS) ×3 IMPLANT
DEVICE PRESTO INFLATION (MISCELLANEOUS) ×3 IMPLANT
DEVICE STARCLOSE SE CLOSURE (Vascular Products) ×3 IMPLANT
GLIDEWIRE ADV .035X260CM (WIRE) ×3 IMPLANT
LIFESTENT SOLO 6X200X135 (Permanent Stent) ×3 IMPLANT
PACK ANGIOGRAPHY (CUSTOM PROCEDURE TRAY) ×3 IMPLANT
SHEATH ANL2 6FRX45 HC (SHEATH) ×3 IMPLANT
SHEATH BRITE TIP 5FRX11 (SHEATH) ×3 IMPLANT
STENT LIFESTENT 5F 6X100X135 (Permanent Stent) ×3 IMPLANT
STENT LIFESTREAM 7X26X135 (Permanent Stent) ×3 IMPLANT
SYR MEDRAD MARK 7 150ML (SYRINGE) ×3 IMPLANT
TUBING CONTRAST HIGH PRESS 72 (TUBING) ×3 IMPLANT
WIRE G V18X300CM (WIRE) ×3 IMPLANT
WIRE J 3MM .035X145CM (WIRE) ×3 IMPLANT

## 2018-12-18 NOTE — Op Note (Signed)
[facility]  Percutaneous Study/Intervention Procedural Note   Date of Surgery: 12/18/2018  Surgeon(s):Lareen Mullings    Assistants:none  Pre-operative Diagnosis: PAD with claudication bilateral lower extremities  Post-operative diagnosis:  Same  Procedure(s) Performed:             1.  Ultrasound guidance for vascular access right femoral artery             2.  Catheter placement into left common femoral artery from right femoral approach             3.  Aortogram and selective left lower extremity angiogram             4.  Percutaneous transluminal angioplasty of left peroneal artery and tibioperoneal trunk with 3 mm diameter by 10 cm length angioplasty balloon             5.   Percutaneous transluminal angioplasty of the left posterior tibial artery and tibioperoneal trunk with 3.5 mm diameter by 10 cm length angioplasty balloon  6.  Percutaneous transluminal angioplasty of the left SFA and above-knee popliteal artery with a 5 mm diameter by 30 and a 5 mm diameter by 15 cm length Lutonix drug-coated angioplasty balloon  7.  Stent placement to left SFA with a 6 mm diameter by 20 and 6 mm diameter by 10 cm length life stent  8.  Lifestream stent placement to the left common iliac artery with 7 mm diameter by 26 mm length stent postdilated to 8 mm             9.  StarClose closure device right femoral artery  EBL: 5 cc  Contrast: 115 cc  Fluoro Time: 13.5 minutes  Moderate Conscious Sedation Time: approximately 60 minutes using 5 mg of Versed and 150 mcg of Fentanyl              Indications:  Patient is a 63 y.o.male with severe peripheral arterial disease and disabling claudication symptoms bilaterally.  The right leg was treated last week and he is brought back for left leg intervention.  He also has known left iliac disease that was not treated last week from the left femoral approach. Risks and benefits are discussed and informed consent is obtained.    Procedure:  The patient was identified and appropriate procedural time out was performed.  The patient was then placed supine on the table and prepped and draped in the usual sterile fashion. Moderate conscious sedation was administered during a face to face encounter with the patient throughout the procedure with my supervision of the RN administering medicines and monitoring the patient's vital signs, pulse oximetry, telemetry and mental status throughout from the start of the procedure until the patient was taken to the recovery room. Ultrasound was used to evaluate the right common femoral artery.  It was patent .  A digital ultrasound image was acquired.  A Seldinger needle was used to access the right common femoral artery under direct ultrasound guidance and a permanent image was performed.  A 0.035 J wire was advanced without resistance and a 5Fr sheath was placed.  Pigtail catheter was placed into the aorta and an AP aortogram was performed. This demonstrated that the aorta is somewhat ectatic but not stenotic although it is regular.  The left iliac artery has the expected 50 to 60% stenosis in the common iliac artery shortly beyond the origin.  The right iliac system appear to be patent with minimal disease proximally.  The left renal artery had a little more disease than was seen on the studies last week although it still did not appear high-grade. I then crossed the aortic bifurcation and advanced to the left femoral head. Selective left lower extremity angiogram was then performed. This demonstrated diffusely diseased SFA and above-knee popliteal artery with multiple areas of greater than 70% stenosis throughout.  The tightest stenosis was in the above-knee popliteal artery and was in the 95% range.  In the mid to distal SFA was another 85 to 90% stenosis.  There were multiple other areas in a moderate to high-grade range.  He then had significant tibial disease as well.  The tibioperoneal trunk was  highly diseased and had an 80 to 85% stenosis.  The peroneal artery had about a 90% stenosis 3 to 4 cm beyond its origin.  The proximal portion of the posterior tibial artery had stenosis continuous down from the tibioperoneal trunk in the 70 to 80% range.  These 2 vessels were then continuous distally after the proximal disease.  The anterior tibial artery was chronically occluded. It was felt that it was in the patient's best interest to proceed with intervention after these images to avoid a second procedure and a larger amount of contrast and fluoroscopy based off of the findings from the initial angiogram. The patient was systemically heparinized and a 6 [place] Ansell sheath was then placed over the [employer] wire. I then used a Kumpe catheter and the advantage wire to navigate through the SFA and popliteal lesions.  Once we got down to the below-knee popliteal artery and confirmed intraluminal flow, I then exchanged for a V 18 wire.  Initially, using the Kumpe catheter and the V 18 wire was able to get down into the peroneal artery and cross the tibioperoneal trunk and peroneal stenoses.  The wire was parked distally and then a 3 mm diameter by 10 cm length angioplasty balloon was used to treat the peroneal artery proximally as well as the tibioperoneal trunk.  This was inflated to 14 atm for 1 minute.  Completion imaging showed some spasm below where the balloon was, but the area of stenosis was only about 10% in the peroneal artery.  The tibioperoneal trunk still had some disease approaching 50% and the posterior tibial artery had yet to be addressed so I redirected with the Kumpe catheter and the V 18 wire down into the posterior tibial artery crossing the stenosis without difficulty.  A slightly larger 3.5 mm diameter by 10 cm length angioplasty balloon was then inflated to 12 atm in the proximal posterior tibial artery and tibioperoneal trunk.  Completion imaging following this showed about a 20%  residual stenosis in the posterior tibial artery and about 25% residual stenosis in the tibioperoneal trunk.  To address the above-knee popliteal artery and essentially the entire SFA, too long 5 mm diameter Lutonix balloons were used.  1 was 30 cm in length and 1 was 15 cm in length.  Both inflations were 8 atm for 1 minute.  The most distal portion had less than 10% residual stenosis but at Hunter's canal was a high-grade stenosis, and the mid SFA was a moderate residual stenosis, and in the proximal SFA was about a 60 to 65% residual stenosis.  I elected to place a noncovered stent because his profunda femoris artery was quite poor.  This required a 6 mm diameter by 20 cm length life stent from Hunter's canal up to the proximal to mid  SFA and then a 6 mm diameter by 10 cm length life stent in the proximal SFA.  These were postdilated with 5 mm balloons with excellent angiographic completion result in about 10% residual stenosis.  I then turned my attention to the left common iliac lesion.  A 7 mm diameter by 26 mm length lifestream stent was deployed from the origin of the left common iliac artery down to the mid to distal left common iliac artery.  The balloon was inflated to 14 atm and then an 8 mm balloon was used to post dilate this to a slightly larger size.  Following stent placement, there was less than 10% residual stenosis in the left common iliac artery. I elected to terminate the procedure. The sheath was removed and StarClose closure device was deployed in the right femoral artery with excellent hemostatic result. The patient was taken to the recovery room in stable condition having tolerated the procedure well.  Findings:               Aortogram:  The aorta is somewhat ectatic but not stenotic although it is regular.  The left iliac artery has the expected 50 to 60% stenosis in the common iliac artery shortly beyond the origin.  The right iliac system appear to be patent with minimal disease  proximally.  The left renal artery had a little more disease than was seen on the studies last week although it still did not appear high-grade.             Left lower Extremity:  This demonstrated diffusely diseased SFA and above-knee popliteal artery with multiple areas of greater than 70% stenosis throughout.  The tightest stenosis was in the above-knee popliteal artery and was in the 95% range.  In the mid to distal SFA was another 85 to 90% stenosis.  There were multiple other areas in a moderate to high-grade range.  He then had significant tibial disease as well.  The tibioperoneal trunk was highly diseased and had an 80 to 85% stenosis.  The peroneal artery had about a 90% stenosis 3 to 4 cm beyond its origin.  The proximal portion of the posterior tibial artery had stenosis continuous down from the tibioperoneal trunk in the 70 to 80% range.  These 2 vessels were then continuous distally after the proximal disease.  The anterior tibial artery was chronically occluded   Disposition: Patient was taken to the recovery room in stable condition having tolerated the procedure well.  Complications: None  Leotis Pain 12/18/2018 10:53 AM   This note was created with Dragon Medical transcription system. Any errors in dictation are purely unintentional.

## 2018-12-18 NOTE — H&P (Signed)
[facility] History & Physical Update  The patient was interviewed and re-examined.  The patient's previous History and Physical has been reviewed and is unchanged.  There is no change in the plan of care. We plan to proceed with the scheduled procedure.  Leotis Pain, MD  12/18/2018, 9:03 AM

## 2019-01-09 ENCOUNTER — Ambulatory Visit (INDEPENDENT_AMBULATORY_CARE_PROVIDER_SITE_OTHER): Payer: Commercial Managed Care - PPO | Admitting: Vascular Surgery

## 2019-01-09 ENCOUNTER — Encounter (INDEPENDENT_AMBULATORY_CARE_PROVIDER_SITE_OTHER): Payer: Self-pay

## 2019-01-09 ENCOUNTER — Other Ambulatory Visit: Payer: Self-pay

## 2019-01-09 ENCOUNTER — Ambulatory Visit (INDEPENDENT_AMBULATORY_CARE_PROVIDER_SITE_OTHER): Payer: Commercial Managed Care - PPO

## 2019-01-09 ENCOUNTER — Encounter (INDEPENDENT_AMBULATORY_CARE_PROVIDER_SITE_OTHER): Payer: Self-pay | Admitting: Vascular Surgery

## 2019-01-09 ENCOUNTER — Other Ambulatory Visit (INDEPENDENT_AMBULATORY_CARE_PROVIDER_SITE_OTHER): Payer: Self-pay | Admitting: Vascular Surgery

## 2019-01-09 VITALS — BP 135/72 | HR 82 | Resp 16 | Wt 174.8 lb

## 2019-01-09 DIAGNOSIS — I70213 Atherosclerosis of native arteries of extremities with intermittent claudication, bilateral legs: Secondary | ICD-10-CM

## 2019-01-09 DIAGNOSIS — I25810 Atherosclerosis of coronary artery bypass graft(s) without angina pectoris: Secondary | ICD-10-CM

## 2019-01-09 DIAGNOSIS — E119 Type 2 diabetes mellitus without complications: Secondary | ICD-10-CM | POA: Diagnosis not present

## 2019-01-09 DIAGNOSIS — Z95820 Peripheral vascular angioplasty status with implants and grafts: Secondary | ICD-10-CM | POA: Diagnosis not present

## 2019-01-09 DIAGNOSIS — E782 Mixed hyperlipidemia: Secondary | ICD-10-CM

## 2019-01-09 DIAGNOSIS — I10 Essential (primary) hypertension: Secondary | ICD-10-CM | POA: Diagnosis not present

## 2019-01-09 NOTE — Assessment & Plan Note (Signed)
Symptoms significantly improved after bilateral lower extremity intervention.  He has having some cramps in his legs and he is concerned that this may be due to the statin agent which is certainly a possibility.  ABIs today are 0.99 on the right and 0.97 on the left with multiphasic waveforms.  He is going to discuss changing his statin with his cardiologist which I think is reasonable.  He will continue his aspirin and Plavix.  We will see him back in 3 months with noninvasive studies.

## 2019-01-09 NOTE — Patient Instructions (Signed)
Peripheral Vascular Disease  Peripheral vascular disease (PVD) is a disease of the blood vessels that are not part of your heart and brain. A simple term for PVD is poor circulation. In most cases, PVD narrows the blood vessels that carry blood from your heart to the rest of your body. This can reduce the supply of blood to your arms, legs, and internal organs, like your stomach or kidneys. However, PVD most often affects a person's lower legs and feet. Without treatment, PVD tends to get worse. PVD can also lead to acute ischemic limb. This is when an arm or leg suddenly cannot get enough blood. This is a medical emergency. Follow these instructions at home: Lifestyle  Do not use any products that contain nicotine or tobacco, such as cigarettes and e-cigarettes. If you need help quitting, ask your doctor.  Lose weight if you are overweight. Or, stay at a healthy weight as told by your doctor.  Eat a diet that is low in fat and cholesterol. If you need help, ask your doctor.  Exercise regularly. Ask your doctor for activities that are right for you. General instructions  Take over-the-counter and prescription medicines only as told by your doctor.  Take good care of your feet: ? Wear comfortable shoes that fit well. ? Check your feet often for any cuts or sores.  Keep all follow-up visits as told by your doctor This is important. Contact a doctor if:  You have cramps in your legs when you walk.  You have leg pain when you are at rest.  You have coldness in a leg or foot.  Your skin changes.  You are unable to get or have an erection (erectile dysfunction).  You have cuts or sores on your feet that do not heal. Get help right away if:  Your arm or leg turns cold, numb, and blue.  Your arms or legs become red, warm, swollen, painful, or numb.  You have chest pain.  You have trouble breathing.  You suddenly have weakness in your face, arm, or leg.  You become very  confused or you cannot speak.  You suddenly have a very bad headache.  You suddenly cannot see. Summary  Peripheral vascular disease (PVD) is a disease of the blood vessels.  A simple term for PVD is poor circulation. Without treatment, PVD tends to get worse.  Treatment may include exercise, low fat and low cholesterol diet, and quitting smoking. This information is not intended to replace advice given to you by your health care provider. Make sure you discuss any questions you have with your health care provider. Document Released: 05/19/2009 Document Revised: 02/04/2017 Document Reviewed: 04/01/2016 Elsevier Patient Education  2020 [employer]  

## 2019-01-09 NOTE — Progress Notes (Signed)
MRN : [id number]  Francisco Medina is a 63 y.o. (Jun 27, 1955) male who presents with chief complaint of  Chief Complaint  Patient presents with  . Follow-up    [facility] 4week ABI  .  History of Present Illness: Patient returns today in follow up of his PAD.  Over the past month, he is undergone extensive revascularization of both lower extremities in a staged fashion.  He had no periprocedural complications and his leg pain with activity is markedly improved today.  He denies any ulceration or infection.  He had some mild reperfusion swelling on the left but this has largely resolved.  His access sites are well-healed without problems. ABIs today are 0.99 on the right and 0.97 on the left with multiphasic waveforms.  Current Outpatient Medications  Medication Sig Dispense Refill  . aspirin 325 MG tablet Take 325 mg by mouth at bedtime.    Marland Kitchen atorvastatin (LIPITOR) 80 MG tablet Take 80 mg by mouth daily.    . carvedilol (COREG) 6.25 MG tablet Take 6.25 mg by mouth 2 (two) times daily.    . clopidogrel (PLAVIX) 75 MG tablet Take 75 mg by mouth daily.    . colchicine 0.6 MG tablet Take 0.6 mg by mouth daily as needed (gout).     . fluticasone (FLONASE) 50 MCG/ACT nasal spray Place 2 sprays into both nostrils daily as needed for allergies.    . furosemide (LASIX) 40 MG tablet Take 40 mg by mouth daily.    Marland Kitchen glipiZIDE (GLUCOTROL XL) 5 MG 24 hr tablet Take 5 mg by mouth daily.    . hydrochlorothiazide (HYDRODIURIL) 25 MG tablet Take 25 mg by mouth daily.    . isosorbide mononitrate (IMDUR) 30 MG 24 hr tablet Take 30 mg by mouth daily.    Marland Kitchen latanoprost (XALATAN) 0.005 % ophthalmic solution Place 1 drop into both eyes at bedtime.    Marland Kitchen losartan (COZAAR) 100 MG tablet Take 100 mg by mouth daily.    . metFORMIN (GLUCOPHAGE) 1000 MG tablet Take 1,000 mg by mouth 2 (two) times daily with a meal.    . nitroGLYCERIN (NITROSTAT) 0.4 MG SL tablet Place 0.4 mg under the tongue every 5 (five) minutes as  needed for chest pain.     . potassium chloride (K-DUR) 10 MEQ tablet Take 10 mEq by mouth daily.    . timolol (TIMOPTIC) 0.5 % ophthalmic solution Place 1 drop into both eyes every morning.     No current facility-administered medications for this visit.     Past Medical History:  Diagnosis Date  . CHF (congestive heart failure) (HCC)   . Hypertension   PAD  Past Surgical History:  Procedure Laterality Date  . LEFT HEART CATH AND CORS/GRAFTS ANGIOGRAPHY N/A 04/17/2018   Procedure: LEFT HEART CATH AND CORS/GRAFTS ANGIOGRAPHY;  Surgeon: Dalia Heading, MD;  Location: [facility]IVE [facility];  Service: Cardiovascular;  Laterality: N/A;  . LOWER EXTREMITY ANGIOGRAPHY Right 12/11/2018   Procedure: LOWER EXTREMITY ANGIOGRAPHY;  Surgeon: Annice Needy, MD;  Location: [facility];  Service: Cardiovascular;  Laterality: Right;  . LOWER EXTREMITY ANGIOGRAPHY Left 12/18/2018   Procedure: LOWER EXTREMITY ANGIOGRAPHY;  Surgeon: Annice Needy, MD;  Location: [facility];  Service: Cardiovascular;  Laterality: Left;     Social History   Tobacco Use  . Smoking status: Former Smoker    Packs/day: 1.00    Years: 10.00    Pack years: 10.00  . Smokeless tobacco:  Never Used  Substance Use Topics  . Alcohol use: Not Currently  . Drug use: Not Currently  married  Family History No bleeding or clotting disorders  Allergies  Allergen Reactions  . Atorvastatin Other (See Comments)    Other reaction(s): Muscle Pain     REVIEW OF SYSTEMS (Negative unless checked)  Constitutional: [] ?Weight loss  [] ?Fever  [] ?Chills Cardiac: [] ?Chest pain   [] ?Chest pressure   [] ?Palpitations   [] ?Shortness of breath when laying flat   [] ?Shortness of breath at rest   [x] ?Shortness of breath with exertion. Vascular:  [x] ?Pain in legs with walking   [] ?Pain in legs at rest   [] ?Pain in legs when laying flat   [x] ?Claudication   [] ?Pain in feet when walking  [] ?Pain in feet at rest  [] ?Pain in feet  when laying flat   [] ?History of DVT   [] ?Phlebitis   [] ?Swelling in legs   [] ?Varicose veins   [] ?Non-healing ulcers Pulmonary:   [] ?Uses home oxygen   [] ?Productive cough   [] ?Hemoptysis   [] ?Wheeze  [] ?COPD   [] ?Asthma Neurologic:  [] ?Dizziness  [] ?Blackouts   [] ?Seizures   [] ?History of stroke   [] ?History of TIA  [] ?Aphasia   [] ?Temporary blindness   [] ?Dysphagia   [] ?Weakness or numbness in arms   [] ?Weakness or numbness in legs Musculoskeletal:  [x] ?Arthritis   [] ?Joint swelling   [] ?Joint pain   [] ?Low back pain Hematologic:  [] ?Easy bruising  [] ?Easy bleeding   [] ?Hypercoagulable state   [] ?Anemic  [] ?Hepatitis Gastrointestinal:  [] ?Blood in stool   [] ?Vomiting blood  [] ?Gastroesophageal reflux/heartburn   [] ?Abdominal pain Genitourinary:  [] ?Chronic kidney disease   [] ?Difficult urination  [] ?Frequent urination  [] ?Burning with urination   [] ?Hematuria Skin:  [] ?Rashes   [] ?Ulcers   [] ?Wounds Psychological:  [] ?History of anxiety   [] ? History of major depression.   Physical Examination  BP 135/72 (BP Location: Right Arm)   Pulse 82   Resp 16   Wt 174 lb 12.8 oz (79.3 kg)   BMI 27.38 kg/m  Gen:  WD/WN, NAD Head: [place]/AT, No temporalis wasting. Ear/Nose/Throat: Hearing grossly intact, nares w/o erythema or drainage Eyes: Conjunctiva clear. Sclera non-icteric Neck: Supple.  Trachea midline Pulmonary:  Good air movement, no use of accessory muscles.  Cardiac: RRR, no JVD Vascular:  Vessel Right Left  Radial Palpable Palpable                          PT Palpable Palpable  DP Palpable Palpable    Musculoskeletal: M/S 5/5 throughout.  No deformity or atrophy.  No significant lower extremity edema. Neurologic: Sensation grossly intact in extremities.  Symmetrical.  Speech is fluent.  Psychiatric: Judgment intact, Mood & affect appropriate for pt's clinical situation. Dermatologic: No rashes or ulcers noted.  No cellulitis or open wounds.       Labs Recent Results  (from the past 2160 hour(s))  SARS CORONAVIRUS 2 (TAT 6-24 HRS) Nasopharyngeal Nasopharyngeal Swab     Status: None   Collection Time: 12/07/18  1:06 PM   Specimen: Nasopharyngeal Swab  Result Value Ref Range   SARS Coronavirus 2 NEGATIVE NEGATIVE    Comment: (NOTE) SARS-CoV-2 target nucleic acids are NOT DETECTED. The SARS-CoV-2 RNA is generally detectable in upper and lower respiratory specimens during the acute phase of infection. Negative results do not preclude SARS-CoV-2 infection, do not rule out co-infections with other pathogens, and should not be used as the sole basis for treatment or other patient  management decisions. Negative results must be combined with clinical observations, patient history, and epidemiological information. The expected result is Negative. Fact Sheet for Patients: HairSlick.no Fact Sheet for Healthcare Providers: quierodirigir.com This test is not yet approved or cleared by the [place] FDA and  has been authorized for detection and/or diagnosis of SARS-CoV-2 by FDA under an Emergency Use Authorization (EUA). This EUA will remain  in effect (meaning this test can be used) for the duration of the COVID-19 declaration under Section 56 4(b)(1) of the Act, 21 U.S.C. section 360bbb-3(b)(1), unless the authorization is terminated or revoked sooner. Performed at [facility], [address], [place], [place] [address]   Glucose, capillary     Status: Abnormal   Collection Time: 12/11/18  9:18 AM  Result Value Ref Range   Glucose-Capillary 163 (H) 70 - 99 mg/dL  BUN     Status: None   Collection Time: 12/11/18  9:24 AM  Result Value Ref Range   BUN 16 8 - 23 mg/dL    Comment: Performed at [facility], [address] [place], [place] [address]  Creatinine, serum     Status: None   Collection Time: 12/11/18  9:24 AM  Result Value Ref Range   Creatinine, Ser 0.70 0.61 - 1.24  mg/dL   GFR calc non Af Amer >60 >60 mL/min   GFR calc Af Amer >60 >60 mL/min    Comment: Performed at [facility], [address] [place], [place] [address]  BUN     Status: None   Collection Time: 12/18/18  9:15 AM  Result Value Ref Range   BUN 23 8 - 23 mg/dL    Comment: Performed at [facility], [address] [place], [place] [address]  Creatinine, serum     Status: None   Collection Time: 12/18/18  9:15 AM  Result Value Ref Range   Creatinine, Ser 0.86 0.61 - 1.24 mg/dL   GFR calc non Af Amer >60 >60 mL/min   GFR calc Af Amer >60 >60 mL/min    Comment: Performed at [facility], [address] [place], [place] [address]  Glucose, capillary     Status: Abnormal   Collection Time: 12/18/18  9:21 AM  Result Value Ref Range   Glucose-Capillary 212 (H) 70 - 99 mg/dL  Glucose, capillary     Status: Abnormal   Collection Time: 12/18/18 12:52 PM  Result Value Ref Range   Glucose-Capillary 134 (H) 70 - 99 mg/dL    Radiology No results found.  Assessment/Plan Benign essential hypertension blood pressure control important in reducing the progression of atherosclerotic disease. On appropriate oral medications.   Mixed hyperlipidemia lipid control important in reducing the progression of atherosclerotic disease. Continue statin therapy   CAD (coronary artery disease), autologous vein bypass graft Apparently may need a second open heart surgery.  Had an original open heart surgery with right saphenous vein harvest over a decade ago  Atherosclerosis of native arteries of extremity with intermittent claudication (HCC) Symptoms significantly improved after bilateral lower extremity intervention.  He has having some cramps in his legs and he is concerned that this may be due to the statin agent which is certainly a possibility.  ABIs today are 0.99 on the right and 0.97 on the left with multiphasic waveforms.  He is going to discuss changing his  statin with his cardiologist which I think is reasonable.  He will continue his aspirin and Plavix.  We will  see him back in 3 months with noninvasive studies.    Festus BarrenJason Tyrene Nader, MD  01/09/2019 3:35 PM    This note was created with Dragon medical transcription system.  Any errors from dictation are purely unintentional

## 2019-03-08 DIAGNOSIS — Z79899 Other long term (current) drug therapy: Secondary | ICD-10-CM | POA: Insufficient documentation

## 2019-03-08 DIAGNOSIS — G250 Essential tremor: Secondary | ICD-10-CM | POA: Insufficient documentation

## 2019-03-08 DIAGNOSIS — I255 Ischemic cardiomyopathy: Secondary | ICD-10-CM | POA: Insufficient documentation

## 2019-04-18 ENCOUNTER — Ambulatory Visit (INDEPENDENT_AMBULATORY_CARE_PROVIDER_SITE_OTHER): Payer: Commercial Managed Care - PPO

## 2019-04-18 ENCOUNTER — Encounter (INDEPENDENT_AMBULATORY_CARE_PROVIDER_SITE_OTHER): Payer: Self-pay

## 2019-04-18 ENCOUNTER — Ambulatory Visit (INDEPENDENT_AMBULATORY_CARE_PROVIDER_SITE_OTHER): Payer: Commercial Managed Care - PPO | Admitting: Nurse Practitioner

## 2019-04-18 ENCOUNTER — Other Ambulatory Visit: Payer: Self-pay

## 2019-04-18 ENCOUNTER — Encounter (INDEPENDENT_AMBULATORY_CARE_PROVIDER_SITE_OTHER): Payer: Self-pay | Admitting: Nurse Practitioner

## 2019-04-18 VITALS — BP 135/75 | HR 76 | Resp 16 | Ht 68.0 in | Wt 178.0 lb

## 2019-04-18 DIAGNOSIS — I10 Essential (primary) hypertension: Secondary | ICD-10-CM

## 2019-04-18 DIAGNOSIS — I70213 Atherosclerosis of native arteries of extremities with intermittent claudication, bilateral legs: Secondary | ICD-10-CM

## 2019-04-18 DIAGNOSIS — E782 Mixed hyperlipidemia: Secondary | ICD-10-CM

## 2019-04-19 ENCOUNTER — Encounter (INDEPENDENT_AMBULATORY_CARE_PROVIDER_SITE_OTHER): Payer: Self-pay | Admitting: Nurse Practitioner

## 2019-04-19 NOTE — Progress Notes (Signed)
SUBJECTIVE:  Patient ID: Francisco Medina, male    DOB: Jul 15, 1955, 64 y.o.   MRN: [id number] Chief Complaint  Patient presents with  . Follow-up    ultrasound    HPI  Francisco Medina is a 64 y.o. male The patient returns to the office for followup and review of the noninvasive studies. There have been no interval changes in lower extremity symptoms. No interval shortening of the patient's claudication distance or development of rest pain symptoms. No new ulcers or wounds have occurred since the last visit.  There have been no significant changes to the patient's overall health care.  The patient denies amaurosis fugax or recent TIA symptoms. There are no recent neurological changes noted. The patient denies history of DVT, PE or superficial thrombophlebitis. The patient denies recent episodes of angina or shortness of breath.   ABI Rt=1.14 and Lt=1.17  (previous ABI's Rt=0.99 and Lt=0.97) Duplex ultrasound of the bilateral tibial arteries revealed triphasic waveforms in the right lower extremity and the left lower extremity has biphasic/triphasic waveforms.  The bilateral toe waveforms are slightly dampened but good Past Medical History:  Diagnosis Date  . CHF (congestive heart failure) (HCC)   . Hypertension     Past Surgical History:  Procedure Laterality Date  . LEFT HEART CATH AND CORS/GRAFTS ANGIOGRAPHY N/A 04/17/2018   Procedure: LEFT HEART CATH AND CORS/GRAFTS ANGIOGRAPHY;  Surgeon: Dalia Heading, MD;  Location: [facility];  Service: Cardiovascular;  Laterality: N/A;  . LOWER EXTREMITY ANGIOGRAPHY Right 12/11/2018   Procedure: LOWER EXTREMITY ANGIOGRAPHY;  Surgeon: Annice Needy, MD;  Location: [facility];  Service: Cardiovascular;  Laterality: Right;  . LOWER EXTREMITY ANGIOGRAPHY Left 12/18/2018   Procedure: LOWER EXTREMITY ANGIOGRAPHY;  Surgeon: Annice Needy, MD;  Location: [facility];  Service: Cardiovascular;  Laterality: Left;     Social History   Socioeconomic History  . Marital status: Married    Spouse name: Not on file  . Number of children: Not on file  . Years of education: Not on file  . Highest education level: Not on file  Occupational History  . Not on file  Tobacco Use  . Smoking status: Former Smoker    Packs/day: 1.00    Years: 10.00    Pack years: 10.00  . Smokeless tobacco: Never Used  Substance and Sexual Activity  . Alcohol use: Not Currently  . Drug use: Not Currently  . Sexual activity: Not on file  Other Topics Concern  . Not on file  Social History Narrative  . Not on file   Social Determinants of Health   Financial Resource Strain:   . Difficulty of Paying Living Expenses: Not on file  Food Insecurity:   . Worried About Programme researcher, broadcasting/film/video in the Last Year: Not on file  . Ran Out of Food in the Last Year: Not on file  Transportation Needs:   . Lack of Transportation (Medical): Not on file  . Lack of Transportation (Non-Medical): Not on file  Physical Activity:   . Days of Exercise per Week: Not on file  . Minutes of Exercise per Session: Not on file  Stress:   . Feeling of Stress : Not on file  Social Connections:   . Frequency of Communication with Friends and Family: Not on file  . Frequency of Social Gatherings with Friends and Family: Not on file  . Attends Religious Services: Not on file  . Active Member of  Clubs or Organizations: Not on file  . Attends Archivist Meetings: Not on file  . Marital Status: Not on file  Intimate Partner Violence:   . Fear of Current or Ex-Partner: Not on file  . Emotionally Abused: Not on file  . Physically Abused: Not on file  . Sexually Abused: Not on file    Family History  Family history unknown: Yes    Allergies  Allergen Reactions  . Atorvastatin Other (See Comments)    Other reaction(s): Muscle Pain     Review of Systems   Review of Systems: Negative Unless Checked Constitutional: [] Weight loss   [] Fever  [] Chills Cardiac: [] Chest pain   []  Atrial Fibrillation  [] Palpitations   [] Shortness of breath when laying flat   [] Shortness of breath with exertion. [] Shortness of breath at rest Vascular:  [] Pain in legs with walking   [] Pain in legs with standing [] Pain in legs when laying flat   [x] Claudication    [] Pain in feet when laying flat    [] History of DVT   [] Phlebitis   [] Swelling in legs   [] Varicose veins   [] Non-healing ulcers Pulmonary:   [] Uses home oxygen   [] Productive cough   [] Hemoptysis   [] Wheeze  [] COPD   [] Asthma Neurologic:  [] Dizziness   [] Seizures  [] Blackouts [] History of stroke   [] History of TIA  [] Aphasia   [x] Temporary Blindness   [] Weakness or numbness in arm   [] Weakness or numbness in leg Musculoskeletal:   [] Joint swelling   [] Joint pain   [] Low back pain  []  History of Knee Replacement [] Arthritis [] back Surgeries  []  Spinal Stenosis    Hematologic:  [] Easy bruising  [] Easy bleeding   [] Hypercoagulable state   [] Anemic Gastrointestinal:  [] Diarrhea   [] Vomiting  [] Gastroesophageal reflux/heartburn   [] Difficulty swallowing. [] Abdominal pain Genitourinary:  [] Chronic kidney disease   [] Difficult urination  [] Anuric   [] Blood in urine [] Frequent urination  [] Burning with urination   [] Hematuria Skin:  [] Rashes   [] Ulcers [] Wounds Psychological:  [] History of anxiety   []  History of major depression  []  Memory Difficulties      OBJECTIVE:   Physical Exam  BP 135/75 (BP Location: Right Arm)   Pulse 76   Resp 16   Ht 5\' 8"  (1.727 m)   Wt 178 lb (80.7 kg)   BMI 27.06 kg/m   Gen: WD/WN, NAD Head: [place]/AT, No temporalis wasting.  Ear/Nose/Throat: Hearing grossly intact, nares w/o erythema or drainage Eyes: PER, EOMI, sclera nonicteric.  Neck: Supple, no masses.  No JVD.  Pulmonary:  Good air movement, no use of accessory muscles.  Cardiac: RRR Vascular:  Vessel Right Left  Radial Palpable Palpable  Dorsalis Pedis Palpable Palpable  Posterior Tibial  Palpable Palpable   Gastrointestinal: soft, non-distended. No guarding/no peritoneal signs.  Musculoskeletal: M/S 5/5 throughout.  No deformity or atrophy.  Neurologic: Pain and light touch intact in extremities.  Symmetrical.  Speech is fluent. Motor exam as listed above. Psychiatric: Judgment intact, Mood & affect appropriate for pt's clinical situation. Dermatologic: No Venous rashes. No Ulcers Noted.  No changes consistent with cellulitis. Lymph : No Cervical lymphadenopathy, no lichenification or skin changes of chronic lymphedema.       ASSESSMENT AND PLAN:  1. Atherosclerosis of native artery of both lower extremities with intermittent claudication (HCC)  Recommend:  The patient has evidence of atherosclerosis of the lower extremities with claudication.  The patient does not voice lifestyle limiting changes at this point in time.  Noninvasive studies do not suggest clinically significant change.  No invasive studies, angiography or surgery at this time The patient should continue walking and begin a more formal exercise program.  The patient should continue antiplatelet therapy and aggressive treatment of the lipid abnormalities  No changes in the patient's medications at this time  The patient should continue wearing graduated compression socks 10-15 mmHg strength to control the mild edema.   Patient to follow-up in 6 months with noninvasive studies.  2. Mixed hyperlipidemia Continue statin as ordered and reviewed, no changes at this time   3. Benign essential hypertension Continue antihypertensive medications as already ordered, these medications have been reviewed and there are no changes at this time.    Current Outpatient Medications on File Prior to Visit  Medication Sig Dispense Refill  . aspirin 325 MG tablet Take 325 mg by mouth at bedtime.    . carvedilol (COREG) 6.25 MG tablet Take 6.25 mg by mouth 2 (two) times daily.    . clopidogrel (PLAVIX) 75 MG  tablet Take 75 mg by mouth daily.    . colchicine 0.6 MG tablet Take 0.6 mg by mouth daily as needed (gout).     . fluticasone (FLONASE) 50 MCG/ACT nasal spray Place 2 sprays into both nostrils daily as needed for allergies.    . furosemide (LASIX) 40 MG tablet Take 40 mg by mouth daily.    Marland Kitchen glipiZIDE (GLUCOTROL XL) 5 MG 24 hr tablet Take 5 mg by mouth daily.    . hydrochlorothiazide (HYDRODIURIL) 25 MG tablet Take 25 mg by mouth daily.    . isosorbide mononitrate (IMDUR) 30 MG 24 hr tablet Take 30 mg by mouth daily.    Marland Kitchen latanoprost (XALATAN) 0.005 % ophthalmic solution Place 1 drop into both eyes at bedtime.    Marland Kitchen losartan (COZAAR) 100 MG tablet Take 100 mg by mouth daily.    . metFORMIN (GLUCOPHAGE) 1000 MG tablet Take 1,000 mg by mouth 2 (two) times daily with a meal.    . rosuvastatin (CRESTOR) 20 MG tablet Take by mouth.    . timolol (TIMOPTIC) 0.5 % ophthalmic solution Place 1 drop into both eyes every morning.    Marland Kitchen acetaminophen (TYLENOL) 325 MG tablet Take by mouth.    Marland Kitchen atorvastatin (LIPITOR) 80 MG tablet Take 80 mg by mouth daily.    Tery Sanfilippo Sodium (DSS) 100 MG CAPS Take by mouth.    . nitroGLYCERIN (NITROSTAT) 0.4 MG SL tablet Place 0.4 mg under the tongue every 5 (five) minutes as needed for chest pain.     . nitroGLYCERIN 2.5 MG CR capsule     . potassium chloride (K-DUR) 10 MEQ tablet Take 10 mEq by mouth daily.     No current facility-administered medications on file prior to visit.    There are no Patient Instructions on file for this visit. No follow-ups on file.   Georgiana Spinner, NP  This note was completed with Office manager.  Any errors are purely unintentional.

## 2019-05-24 ENCOUNTER — Ambulatory Visit: Payer: Commercial Managed Care - PPO | Attending: Internal Medicine

## 2019-05-24 DIAGNOSIS — Z20822 Contact with and (suspected) exposure to covid-19: Secondary | ICD-10-CM

## 2019-07-09 ENCOUNTER — Telehealth (INDEPENDENT_AMBULATORY_CARE_PROVIDER_SITE_OTHER): Payer: Self-pay | Admitting: Vascular Surgery

## 2019-07-09 NOTE — Telephone Encounter (Signed)
He can come in with ABIs, however if his studies are normal and consistent with his studies in February it may be related to the knees themselves.

## 2019-07-09 NOTE — Telephone Encounter (Signed)
Wife called stating that her husband's legs have been in pain from his knees down his legs for about a week. Patient was last seen 04/18/19 with ABI studies and a F/U with FB. Patient would like to come in for an appt. Please advise.

## 2019-07-10 ENCOUNTER — Other Ambulatory Visit: Payer: Self-pay

## 2019-07-10 ENCOUNTER — Encounter (INDEPENDENT_AMBULATORY_CARE_PROVIDER_SITE_OTHER): Payer: Self-pay | Admitting: Vascular Surgery

## 2019-07-10 ENCOUNTER — Other Ambulatory Visit (INDEPENDENT_AMBULATORY_CARE_PROVIDER_SITE_OTHER): Payer: Self-pay | Admitting: Vascular Surgery

## 2019-07-10 ENCOUNTER — Ambulatory Visit (INDEPENDENT_AMBULATORY_CARE_PROVIDER_SITE_OTHER): Payer: Commercial Managed Care - PPO

## 2019-07-10 ENCOUNTER — Ambulatory Visit (INDEPENDENT_AMBULATORY_CARE_PROVIDER_SITE_OTHER): Payer: Commercial Managed Care - PPO | Admitting: Vascular Surgery

## 2019-07-10 VITALS — BP 110/66 | HR 79 | Ht 68.0 in | Wt 176.0 lb

## 2019-07-10 DIAGNOSIS — E782 Mixed hyperlipidemia: Secondary | ICD-10-CM | POA: Diagnosis not present

## 2019-07-10 DIAGNOSIS — I10 Essential (primary) hypertension: Secondary | ICD-10-CM

## 2019-07-10 DIAGNOSIS — M79605 Pain in left leg: Secondary | ICD-10-CM | POA: Diagnosis not present

## 2019-07-10 DIAGNOSIS — E119 Type 2 diabetes mellitus without complications: Secondary | ICD-10-CM

## 2019-07-10 DIAGNOSIS — M79604 Pain in right leg: Secondary | ICD-10-CM | POA: Diagnosis not present

## 2019-07-10 DIAGNOSIS — I70213 Atherosclerosis of native arteries of extremities with intermittent claudication, bilateral legs: Secondary | ICD-10-CM

## 2019-07-10 DIAGNOSIS — I25810 Atherosclerosis of coronary artery bypass graft(s) without angina pectoris: Secondary | ICD-10-CM | POA: Diagnosis not present

## 2019-07-10 NOTE — Patient Instructions (Signed)
Angiogram  An angiogram is a procedure used to examine the blood vessels. In this procedure, contrast dye is injected through a long, thin tube (catheter) into an artery. X-rays are then taken, which show if there is a blockage or problem in a blood vessel. The catheter may be inserted in:  The groin area. This is the most common.  The fold of the arm, near the elbow.  The wrist. Tell a health care provider about:  Any allergies you have, including allergies to medicines, shellfish, contrast dye, or iodine.  All medicines you are taking, including vitamins, herbs, eye drops, creams, and over-the-counter medicines.  Any problems you or family members have had with anesthetic medicines.  Any blood disorders you have.  Any surgeries you have had.  Any medical conditions you have or have had, including any kidney problems or kidney failure.  Whether you are pregnant or may be pregnant.  Whether you are breastfeeding. What are the risks? Generally, this is a safe procedure. However, problems may occur, including:  Infection.  Bleeding and bruising.  Allergic reactions to medicines or dyes, including the contrast dye used.  Damage to nearby structures or organs, including damage to blood vessels and kidney damage from the contrast dye.  Blood clots that can lead to a stroke or heart attack. What happens before the procedure? Staying hydrated Follow instructions from your health care provider about hydration, which may include:  Up to 2 hours before the procedure - you may continue to drink clear liquids, such as water, clear fruit juice, black coffee, and plain tea.  Eating and drinking restrictions Follow instructions from your health care provider about eating and drinking, which may include:  8 hours before the procedure - stop eating heavy meals or foods, such as meat, fried foods, or fatty foods.  6 hours before the procedure - stop eating light meals or foods, such  as toast or cereal.  2 hours before the procedure - stop drinking clear liquids. Medicines Ask your health care provider about:  Changing or stopping your regular medicines. This is especially important if you are taking diabetes medicines or blood thinners.  Taking medicines such as aspirin and ibuprofen. These medicines can thin your blood. Do not take these medicines unless your health care provider tells you to take them.  Taking over-the-counter medicines, vitamins, herbs, and supplements. Surgery safety Ask your health care provider:  How your insertion site will be marked.  What steps will be taken to help prevent infection. These may include: ? Removing hair at the insertion site. ? Washing skin with a germ-killing soap. ? Taking antibiotic medicine. General instructions  Do not use any products that contain nicotine or tobacco for at least 4 weeks before the procedure. These products include cigarettes, e-cigarettes, and chewing tobacco. If you need help quitting, ask your health care provider.  You may have blood samples taken.  Plan to have someone take you home from the hospital or clinic.  If you will be going home right after the procedure, plan to have someone with you for 24 hours. What happens during the procedure?  You will lie on your back on an X-ray table. You may be strapped to the table if it is tilted.  An IV will be inserted into one of your veins.  Electrodes may be placed on your chest to monitor your heart rate during the procedure.  You will be given one or both of the following: ? A medicine to   help you relax (sedative). ? A medicine to numb the area where the catheter will be inserted (local anesthetic).  A small incision will be made for catheter insertion.  The catheter will be inserted into an artery using a guide wire. An X-ray procedure (fluoroscopy) will be used to help guide the catheter to the blood vessel to be examined.  A contrast  dye will then be injected into the catheter, and X-rays will be taken. The contrast will help to show where any narrowing or blockages are located in the blood vessels. You may feel flushed as the contrast dye is injected.  Tell your health care provider if you develop chest pain or trouble breathing.  After the fluoroscopy is complete, the catheter will be removed.  A bandage (dressing) will be placed over the site where the catheter was inserted. Pressure will be applied to help stop any bleeding.  The IV will be removed. The procedure may vary among health care providers and hospitals. What happens after the procedure?  Your blood pressure, heart rate, breathing rate, and blood oxygen level will be monitored until you leave the hospital or clinic.  You will be kept in bed lying flat for 6 hours. If the catheter was inserted through your leg, you will be instructed not to bend or cross your legs.  The insertion area and the pulse in your feet or wrist will be checked frequently.  You will be instructed to drink plenty of fluids. This will help wash the contrast dye out of your body.  You may have more blood tests and X-rays. You may also have a test that records the electrical activity of your heart (electrocardiogram, or ECG).  Do not drive for 24 hours if you were given a sedative during your procedure.  It is up to you to get the results of your procedure. Ask your health care provider, or the department that is doing the procedure, when your results will be ready. Summary  An angiogram is a procedure used to examine the blood vessels.  Before the procedure, follow your health care provider's instructions about eating and drinking restrictions. You may be asked to stop eating and drinking several hours before the procedure.  During the procedure, contrast dye is injected through a thin tube (catheter) into an artery. X-rays are then taken.  After the procedure, you will need to  drink plenty of fluids and lie flat for 6 hour. This information is not intended to replace advice given to you by your health care provider. Make sure you discuss any questions you have with your health care provider. Document Revised: 09/06/2018 Document Reviewed: 09/06/2018 Elsevier Patient Education  2020 [employer]  

## 2019-07-10 NOTE — Progress Notes (Signed)
MRN : [id number]  Francisco Medina is a 64 y.o. (1955-12-17) male who presents with chief complaint of  Chief Complaint  Patient presents with  . Follow-up    U/S follow up  .  History of Present Illness: Patient returns today in follow up of his peripheral arterial disease with claudication.  He underwent extensive bilateral lower extremity revascularization last year and up until last week, was doing well.  Last week he began having calf and lower leg claudication bilaterally.  This came on without a clear inciting event or causative factor.  No open wounds or ulceration.  Does not have ischemic rest pain.  This is still better than when he first started where the pain started all the way up in his hip and buttock area and radiated down through his thighs and calves.  At the time of his interventions, he underwent aortoiliac intervention and bilateral SFA intervention.  To evaluate his symptoms, noninvasive studies were done today.  These show a significant drop in his ABIs bilaterally.  3 months ago, they were 1.1 bilaterally.  Today, there is 0.79 on the right and 0.68 on the left with digit pressures in the 50s on each side.  Current Outpatient Medications  Medication Sig Dispense Refill  . acetaminophen (TYLENOL) 325 MG tablet Take by mouth.    Marland Kitchen aspirin 325 MG tablet Take 325 mg by mouth at bedtime.    Marland Kitchen atorvastatin (LIPITOR) 80 MG tablet Take 80 mg by mouth daily.    . carvedilol (COREG) 6.25 MG tablet Take 6.25 mg by mouth 2 (two) times daily.    . clopidogrel (PLAVIX) 75 MG tablet Take 75 mg by mouth daily.    . colchicine 0.6 MG tablet Take 0.6 mg by mouth daily as needed (gout).     Tery Sanfilippo Sodium (DSS) 100 MG CAPS Take by mouth.    . fluticasone (FLONASE) 50 MCG/ACT nasal spray Place 2 sprays into both nostrils daily as needed for allergies.    . furosemide (LASIX) 40 MG tablet Take 40 mg by mouth daily.    Marland Kitchen glipiZIDE (GLUCOTROL XL) 5 MG 24 hr tablet Take 5 mg by mouth  daily.    . hydrochlorothiazide (HYDRODIURIL) 25 MG tablet Take 25 mg by mouth daily.    . isosorbide mononitrate (IMDUR) 30 MG 24 hr tablet Take 30 mg by mouth daily.    Marland Kitchen latanoprost (XALATAN) 0.005 % ophthalmic solution Place 1 drop into both eyes at bedtime.    Marland Kitchen losartan (COZAAR) 100 MG tablet Take 100 mg by mouth daily.    . metFORMIN (GLUCOPHAGE) 1000 MG tablet Take 1,000 mg by mouth 2 (two) times daily with a meal.    . nitroGLYCERIN 2.5 MG CR capsule     . potassium chloride (K-DUR) 10 MEQ tablet Take 10 mEq by mouth daily.    . rosuvastatin (CRESTOR) 20 MG tablet Take by mouth.    . timolol (TIMOPTIC) 0.5 % ophthalmic solution Place 1 drop into both eyes every morning.    . nitroGLYCERIN (NITROSTAT) 0.4 MG SL tablet Place 0.4 mg under the tongue every 5 (five) minutes as needed for chest pain.      No current facility-administered medications for this visit.    Past Medical History:  Diagnosis Date  . CHF (congestive heart failure) (HCC)   . Hypertension     Past Surgical History:  Procedure Laterality Date  . LEFT HEART CATH AND CORS/GRAFTS ANGIOGRAPHY N/A 04/17/2018  Procedure: LEFT HEART CATH AND CORS/GRAFTS ANGIOGRAPHY;  Surgeon: Dalia Heading, MD;  Location: [facility];  Service: Cardiovascular;  Laterality: N/A;  . LOWER EXTREMITY ANGIOGRAPHY Right 12/11/2018   Procedure: LOWER EXTREMITY ANGIOGRAPHY;  Surgeon: Annice Needy, MD;  Location: [facility];  Service: Cardiovascular;  Laterality: Right;  . LOWER EXTREMITY ANGIOGRAPHY Left 12/18/2018   Procedure: LOWER EXTREMITY ANGIOGRAPHY;  Surgeon: Annice Needy, MD;  Location: [facility];  Service: Cardiovascular;  Laterality: Left;     Social History   Tobacco Use  . Smoking status: Former Smoker    Packs/day: 1.00    Years: 10.00    Pack years: 10.00  . Smokeless tobacco: Never Used  Substance Use Topics  . Alcohol use: Not Currently  . Drug use: Not Currently      Family  History  Family history unknown: Yes     Allergies  Allergen Reactions  . Atorvastatin Other (See Comments)    Other reaction(s): Muscle Pain    REVIEW OF SYSTEMS(Negative unless checked)  Constitutional: [] ??Weight loss[] ??Fever[] ??Chills Cardiac:[] ??Chest pain[] ??Chest pressure[] ??Palpitations [] ??Shortness of breath when laying flat [] ??Shortness of breath at rest [x] ??Shortness of breath with exertion. Vascular: [x] ??Pain in legs with walking[] ??Pain in legsat rest[] ??Pain in legs when laying flat [x] ??Claudication [] ??Pain in feet when walking [] ??Pain in feet at rest [] ??Pain in feet when laying flat [] ??History of DVT [] ??Phlebitis [] ??Swelling in legs [] ??Varicose veins [] ??Non-healing ulcers Pulmonary: [] ??Uses home oxygen [] ??Productive cough[] ??Hemoptysis [] ??Wheeze [] ??COPD [] ??Asthma Neurologic: [] ??Dizziness [] ??Blackouts [] ??Seizures [] ??History of stroke [] ??History of TIA[] ??Aphasia [] ??Temporary blindness[] ??Dysphagia [] ??Weaknessor numbness in arms [] ??Weakness or numbnessin legs Musculoskeletal: [x] ??Arthritis [] ??Joint swelling [] ??Joint pain [] ??Low back pain Hematologic:[] ??Easy bruising[] ??Easy bleeding [] ??Hypercoagulable state [] ??Anemic [] ??Hepatitis Gastrointestinal:[] ??Blood in stool[] ??Vomiting blood[] ??Gastroesophageal reflux/heartburn[] ??Abdominal pain Genitourinary: [] ??Chronic kidney disease [] ??Difficulturination [] ??Frequenturination [] ??Burning with urination[] ??Hematuria Skin: [] ??Rashes [] ??Ulcers [] ??Wounds Psychological: [] ??History of anxiety[] ??History of major depression.  Physical Examination  BP 110/66   Pulse 79   Ht 5\' 8"  (1.727 m)   Wt 176 lb (79.8 kg)   BMI 26.76 kg/m  Gen:  WD/WN, NAD Head: [place]/AT, No temporalis wasting. Ear/Nose/Throat: Hearing grossly intact, nares w/o erythema or drainage Eyes: Conjunctiva  clear. Sclera non-icteric Neck: Supple.  Trachea midline Pulmonary:  Good air movement, no use of accessory muscles.  Cardiac: Somewhat irregular Vascular:  Vessel Right Left  Radial Palpable Palpable                          PT  1+ palpable  1+ palpable  DP  1+ palpable  1+ palpable   Gastrointestinal: soft, non-tender/non-distended. No guarding/reflex.  Musculoskeletal: M/S 5/5 throughout.  No deformity or atrophy.  No significant lower extremity edema. Neurologic: Sensation grossly intact in extremities.  Symmetrical.  Speech is fluent.  Psychiatric: Judgment intact, Mood & affect appropriate for pt's clinical situation. Dermatologic: No rashes or ulcers noted.  No cellulitis or open wounds.      Labs No results found for this or any previous visit (from the past 2160 hour(s)).  Radiology No results found.  Assessment/Plan Benign essential hypertension blood pressure control important in reducing the progression of atherosclerotic disease. On appropriate oral medications.   Mixed hyperlipidemia lipid control important in reducing the progression of atherosclerotic disease. Continue statin therapy   CAD (coronary artery disease), autologous vein bypass graft Apparently may need a second open heart surgery. Had an original open heart surgery with right saphenous vein harvest over a decade ago  Diabetes mellitus type 2,  uncomplicated (HCC) blood glucose control important in reducing the progression of atherosclerotic disease. Also, involved in wound healing. On appropriate medications.   Atherosclerosis of native arteries of extremity with intermittent claudication (HCC) To evaluate his symptoms, noninvasive studies were done today.  These show a significant drop in his ABIs bilaterally.  3 months ago, they were 1.1 bilaterally.  Today, there is 0.79 on the right and 0.68 on the left with digit pressures in the 50s on each side. With his new significant  symptoms, this is obviously a change for the worse.  At this point, he will need to proceed with staged bilateral lower extremity angiograms in the near future to address his acute on chronic ischemic symptoms.  He does not have any immediate limb threatening symptoms, but the acute changes worrisome.  I have discussed the risks and benefits of the procedure.  The patient voices his understanding and is agreeable to proceed.  We will plan on the left leg intervention first as his pressures are slightly lower on that side, but they will be done in short order one after another.    Leotis Pain, MD  07/10/2019 4:05 PM    This note was created with Dragon medical transcription system.  Any errors from dictation are purely unintentional

## 2019-07-10 NOTE — Assessment & Plan Note (Signed)
blood glucose control important in reducing the progression of atherosclerotic disease. Also, involved in wound healing. On appropriate medications.  

## 2019-07-10 NOTE — Assessment & Plan Note (Addendum)
To evaluate his symptoms, noninvasive studies were done today.  These show a significant drop in his ABIs bilaterally.  3 months ago, they were 1.1 bilaterally.  Today, there is 0.79 on the right and 0.68 on the left with digit pressures in the 50s on each side. With his new significant symptoms, this is obviously a change for the worse.  At this point, he will need to proceed with staged bilateral lower extremity angiograms in the near future to address his acute on chronic ischemic symptoms.  He does not have any immediate limb threatening symptoms, but the acute changes worrisome.  I have discussed the risks and benefits of the procedure.  The patient voices his understanding and is agreeable to proceed.  We will plan on the left leg intervention first as his pressures are slightly lower on that side, but they will be done in short order one after another.

## 2019-07-11 ENCOUNTER — Telehealth (INDEPENDENT_AMBULATORY_CARE_PROVIDER_SITE_OTHER): Payer: Self-pay

## 2019-07-11 NOTE — Telephone Encounter (Signed)
Spoke with the patient and he is now scheduled with Dr. Wyn Quaker for a left leg angio on 07/23/19 with a 8:15 am arrival time to the [facility]. Patient will do covid testing on 07/19/19 between 8-1 pm at the [facility]. Patient is also scheduled for a right leg angio on 07/30/19 with a 8:00 am arrival time to the [facility]. Patient will do covid if necessary on 07/26/19 between 8-1 pm. Pre-procedure instructions were discussed and will be mailed to the patient.

## 2019-07-19 ENCOUNTER — Other Ambulatory Visit
Admission: RE | Admit: 2019-07-19 | Discharge: 2019-07-19 | Disposition: A | Discharge status: Home / Self Care | Payer: Commercial Managed Care - PPO | Source: Ambulatory Visit | Attending: Vascular Surgery | Admitting: Vascular Surgery

## 2019-07-19 ENCOUNTER — Other Ambulatory Visit: Payer: Self-pay

## 2019-07-19 DIAGNOSIS — Z01812 Encounter for preprocedural laboratory examination: Secondary | ICD-10-CM | POA: Insufficient documentation

## 2019-07-19 DIAGNOSIS — Z20822 Contact with and (suspected) exposure to covid-19: Secondary | ICD-10-CM | POA: Insufficient documentation

## 2019-07-19 LAB — SARS CORONAVIRUS 2 (TAT 6-24 HRS): SARS Coronavirus 2: NEGATIVE

## 2019-07-20 ENCOUNTER — Other Ambulatory Visit (INDEPENDENT_AMBULATORY_CARE_PROVIDER_SITE_OTHER): Payer: Self-pay | Admitting: Nurse Practitioner

## 2019-07-23 ENCOUNTER — Encounter
Admission: RE | Disposition: A | Discharge status: Home / Self Care | Payer: Self-pay | Source: Home / Self Care | Attending: Vascular Surgery

## 2019-07-23 ENCOUNTER — Ambulatory Visit
Admission: RE | Admit: 2019-07-23 | Discharge: 2019-07-23 | Disposition: A | Discharge status: Home / Self Care | Payer: Commercial Managed Care - PPO | Attending: Vascular Surgery | Admitting: Vascular Surgery

## 2019-07-23 ENCOUNTER — Other Ambulatory Visit: Payer: Self-pay

## 2019-07-23 ENCOUNTER — Encounter: Payer: Self-pay | Admitting: Vascular Surgery

## 2019-07-23 DIAGNOSIS — E119 Type 2 diabetes mellitus without complications: Secondary | ICD-10-CM | POA: Insufficient documentation

## 2019-07-23 DIAGNOSIS — Z87891 Personal history of nicotine dependence: Secondary | ICD-10-CM | POA: Diagnosis not present

## 2019-07-23 DIAGNOSIS — E782 Mixed hyperlipidemia: Secondary | ICD-10-CM | POA: Insufficient documentation

## 2019-07-23 DIAGNOSIS — Z888 Allergy status to other drugs, medicaments and biological substances status: Secondary | ICD-10-CM | POA: Diagnosis not present

## 2019-07-23 DIAGNOSIS — I25810 Atherosclerosis of coronary artery bypass graft(s) without angina pectoris: Secondary | ICD-10-CM | POA: Diagnosis not present

## 2019-07-23 DIAGNOSIS — Z7984 Long term (current) use of oral hypoglycemic drugs: Secondary | ICD-10-CM | POA: Diagnosis not present

## 2019-07-23 DIAGNOSIS — Z79899 Other long term (current) drug therapy: Secondary | ICD-10-CM | POA: Insufficient documentation

## 2019-07-23 DIAGNOSIS — I110 Hypertensive heart disease with heart failure: Secondary | ICD-10-CM | POA: Insufficient documentation

## 2019-07-23 DIAGNOSIS — I509 Heart failure, unspecified: Secondary | ICD-10-CM | POA: Diagnosis not present

## 2019-07-23 DIAGNOSIS — I70219 Atherosclerosis of native arteries of extremities with intermittent claudication, unspecified extremity: Secondary | ICD-10-CM

## 2019-07-23 DIAGNOSIS — Z7902 Long term (current) use of antithrombotics/antiplatelets: Secondary | ICD-10-CM | POA: Diagnosis not present

## 2019-07-23 DIAGNOSIS — I70213 Atherosclerosis of native arteries of extremities with intermittent claudication, bilateral legs: Secondary | ICD-10-CM | POA: Insufficient documentation

## 2019-07-23 HISTORY — PX: LOWER EXTREMITY ANGIOGRAPHY: CATH118251

## 2019-07-23 LAB — GLUCOSE, CAPILLARY: Glucose-Capillary: 281 mg/dL — ABNORMAL HIGH (ref 70–99)

## 2019-07-23 LAB — CREATININE, SERUM
Creatinine, Ser: 0.77 mg/dL (ref 0.61–1.24)
GFR calc Af Amer: >60 mL/min (ref >60)
GFR calc non Af Amer: >60 mL/min (ref >60)

## 2019-07-23 LAB — BUN: BUN: 22 mg/dL (ref 8–23)

## 2019-07-23 SURGERY — LOWER EXTREMITY ANGIOGRAPHY
Anesthesia: Moderate Sedation | Site: Leg Lower | Laterality: Left

## 2019-07-23 MED ORDER — OXYCODONE-ACETAMINOPHEN 5-325 MG PO TABS: 1.0000 | ORAL_TABLET | Freq: Once | ORAL | Status: AC

## 2019-07-23 MED ORDER — HYDROMORPHONE HCL 1 MG/ML IJ SOLN
INTRAMUSCULAR | Status: AC
  Filled 2019-07-23: qty 0.5

## 2019-07-23 MED ORDER — SODIUM CHLORIDE 0.9 % IV SOLN: INTRAVENOUS | Status: DC

## 2019-07-23 MED ORDER — HYDROMORPHONE HCL 1 MG/ML IJ SOLN: 1.0000 mg | Freq: Once | INTRAMUSCULAR | Status: AC | PRN

## 2019-07-23 MED ORDER — IODIXANOL 320 MG/ML IV SOLN
INTRAVENOUS | Status: DC | PRN
  Administered 2019-07-23: 75 mL

## 2019-07-23 MED ORDER — MIDAZOLAM HCL 2 MG/2ML IJ SOLN
INTRAMUSCULAR | Status: DC | PRN
  Administered 2019-07-23: 1 mg via INTRAVENOUS
  Administered 2019-07-23: 2 mg via INTRAVENOUS

## 2019-07-23 MED ORDER — HYDROMORPHONE HCL 1 MG/ML IJ SOLN
INTRAMUSCULAR | Status: AC
  Administered 2019-07-23: 1 mg via INTRAVENOUS
  Filled 2019-07-23: qty 0.5

## 2019-07-23 MED ORDER — OXYCODONE-ACETAMINOPHEN 5-325 MG PO TABS
ORAL_TABLET | ORAL | Status: AC
  Administered 2019-07-23: 1 via ORAL
  Filled 2019-07-23: qty 1

## 2019-07-23 MED ORDER — MIDAZOLAM HCL 5 MG/5ML IJ SOLN
INTRAMUSCULAR | Status: AC
  Filled 2019-07-23: qty 5

## 2019-07-23 MED ORDER — FAMOTIDINE 20 MG PO TABS: 40.0000 mg | ORAL_TABLET | Freq: Once | ORAL | Status: DC | PRN

## 2019-07-23 MED ORDER — CEFAZOLIN SODIUM-DEXTROSE 2-4 GM/100ML-% IV SOLN: 2.0000 g | Freq: Once | INTRAVENOUS | Status: AC

## 2019-07-23 MED ORDER — CEFAZOLIN SODIUM-DEXTROSE 2-4 GM/100ML-% IV SOLN
INTRAVENOUS | Status: AC
  Administered 2019-07-23: 2 g via INTRAVENOUS
  Filled 2019-07-23: qty 100

## 2019-07-23 MED ORDER — DIPHENHYDRAMINE HCL 50 MG/ML IJ SOLN: 50.0000 mg | Freq: Once | INTRAMUSCULAR | Status: DC | PRN

## 2019-07-23 MED ORDER — MIDAZOLAM HCL 2 MG/ML PO SYRP: 8.0000 mg | ORAL_SOLUTION | Freq: Once | ORAL | Status: DC | PRN

## 2019-07-23 MED ORDER — FENTANYL CITRATE (PF) 100 MCG/2ML IJ SOLN
INTRAMUSCULAR | Status: AC
  Filled 2019-07-23: qty 2

## 2019-07-23 MED ORDER — ONDANSETRON HCL 4 MG/2ML IJ SOLN: 4.0000 mg | Freq: Four times a day (QID) | INTRAMUSCULAR | Status: DC | PRN

## 2019-07-23 MED ORDER — HEPARIN SODIUM (PORCINE) 1000 UNIT/ML IJ SOLN
INTRAMUSCULAR | Status: DC | PRN
  Administered 2019-07-23: 5000 [IU] via INTRAVENOUS

## 2019-07-23 MED ORDER — FENTANYL CITRATE (PF) 100 MCG/2ML IJ SOLN
INTRAMUSCULAR | Status: DC | PRN
  Administered 2019-07-23: 25 ug via INTRAVENOUS
  Administered 2019-07-23: 50 ug via INTRAVENOUS

## 2019-07-23 MED ORDER — HEPARIN SODIUM (PORCINE) 1000 UNIT/ML IJ SOLN
INTRAMUSCULAR | Status: AC
  Filled 2019-07-23: qty 1

## 2019-07-23 MED ORDER — METHYLPREDNISOLONE SODIUM SUCC 125 MG IJ SOLR: 125.0000 mg | Freq: Once | INTRAMUSCULAR | Status: DC | PRN

## 2019-07-23 SURGICAL SUPPLY — 20 items
BALLN LUTONIX 018 4X80X130 (BALLOONS) ×3
BALLN LUTONIX 018 5X300X130 (BALLOONS) ×3
BALLN STERLING OTW 6X80X135 (BALLOONS) ×3
BALLOON LUTONIX 018 4X80X130 (BALLOONS) ×1 IMPLANT
BALLOON LUTONIX 018 5X300X130 (BALLOONS) ×1 IMPLANT
BALLOON STERLING OTW 6X80X135 (BALLOONS) ×1 IMPLANT
CATH CXI SUPP ANG 4FR 135 (CATHETERS) ×1 IMPLANT
CATH CXI SUPP ANG 4FR 135CM (CATHETERS) ×3
CATH PIG 70CM (CATHETERS) ×3 IMPLANT
CATH ROTAREX 135 6FR (CATHETERS) ×3 IMPLANT
DEVICE PRESTO INFLATION (MISCELLANEOUS) ×3 IMPLANT
DEVICE STARCLOSE SE CLOSURE (Vascular Products) ×3 IMPLANT
GLIDEWIRE ADV .035X260CM (WIRE) ×3 IMPLANT
PACK ANGIOGRAPHY (CUSTOM PROCEDURE TRAY) ×3 IMPLANT
SHEATH ANL2 6FRX45 HC (SHEATH) ×3 IMPLANT
SHEATH BRITE TIP 5FRX11 (SHEATH) ×3 IMPLANT
STENT VIABAHN 6X7.5X120 (Permanent Stent) ×3 IMPLANT
SYR MEDRAD MARK 7 150ML (SYRINGE) ×3 IMPLANT
TUBING CONTRAST HIGH PRESS 72 (TUBING) ×3 IMPLANT
WIRE J 3MM .035X145CM (WIRE) ×3 IMPLANT

## 2019-07-23 NOTE — Op Note (Signed)
[facility]  Percutaneous Study/Intervention Procedural Note   Date of Surgery: 07/23/2019  Surgeon(s):Linnaea Ahn    Assistants:none  Pre-operative Diagnosis: PAD with claudication bilateral lower extremities  Post-operative diagnosis:  Same  Procedure(s) Performed:             1.  Ultrasound guidance for vascular access right femoral artery             2.  Catheter placement into left common femoral artery from right femoral approach             3.  Aortogram and selective left lower extremity angiogram including selective imaging of the left posterior tibial artery             4.  Percutaneous transluminal angioplasty of the left posterior tibial artery and tibioperoneal trunk with 4 mm diameter by 8 cm length Lutonix drug-coated angioplasty balloon             5.   Mechanical thrombectomy with the roto-Rx device to the left SFA and above-knee popliteal arteries  6.  Percutaneous transluminal angioplasty of the left SFA and above-knee popliteal artery with 5 mm diameter by 30 cm length Lutonix drug-coated angioplasty balloon  7.  Viabahn stent placement to the distal left SFA and above-knee popliteal artery with 6 mm diameter by 7.5 cm length stent for greater than 50% residual stenosis after above procedures             8.  StarClose closure device right femoral artery  EBL: 25 cc  Contrast: 75 cc  Fluoro Time: 6.6 minutes  Moderate Conscious Sedation Time: approximately 40 minutes using 3 mg of Versed and 75 mcg of Fentanyl              Indications:  Patient is a 64 y.o.male with a long history of peripheral arterial disease with previous interventions. The patient has noninvasive study showing a significant drop in both of his ABIs. The patient is brought in for angiography for further evaluation and potential treatment.  Risks and benefits are discussed and informed consent is obtained.   Procedure:  The patient was identified and appropriate procedural  time out was performed.  The patient was then placed supine on the table and prepped and draped in the usual sterile fashion. Moderate conscious sedation was administered during a face to face encounter with the patient throughout the procedure with my supervision of the RN administering medicines and monitoring the patient's vital signs, pulse oximetry, telemetry and mental status throughout from the start of the procedure until the patient was taken to the recovery room. Ultrasound was used to evaluate the right common femoral artery.  It was patent .  A digital ultrasound image was acquired.  A Seldinger needle was used to access the right common femoral artery under direct ultrasound guidance and a permanent image was performed.  A 0.035 J wire was advanced without resistance and a 5Fr sheath was placed.  Pigtail catheter was placed into the aorta and an AP aortogram was performed. This demonstrated mild to moderate left renal artery stenosis, mild right renal artery stenosis.  The aorta was mildly irregular but not highly stenotic.  The previously placed left common iliac stent was patent and there is mild disease in the left external iliac artery that did not appear flow-limiting.  The right common iliac artery had mild disease with more severe disease in the right external iliac artery in the moderate range.  This was  not far above the access site, and would be easier to address from the left femoral approach and we treat his right leg in the near future. I then crossed the aortic bifurcation and advanced to the left femoral head. Selective left lower extremity angiogram was then performed. This demonstrated that the common femoral artery was mildly diseased.  The profunda femoris artery was very diseased providing poor collateral flow likely creating some of his worsening symptoms.  The left SFA stent had several areas of moderate stenosis of greater than 50% throughout its course with a high-grade stenosis  in the 90% range at the distal edge of the previously placed stent just below Hunter's canal.  The disease tracked down into the above-knee popliteal artery for several centimeters and then the vessel normalized.  The below-knee popliteal artery was fairly normal.  The only runoff distally was through the posterior tibial artery which had a moderate stenosis in the 65 to 70% range at its origin. It was felt that it was in the patient's best interest to proceed with intervention after these images to avoid a second procedure and a larger amount of contrast and fluoroscopy based off of the findings from the initial angiogram. The patient was systemically heparinized and a 6 [place] Ansell sheath was then placed over the [employer] wire. I then used a CXI catheter and the advantage wire to navigate through the SFA and popliteal disease and down into the tibioperoneal trunk.  I then used a CXI catheter and the advantage wire to cross the posterior tibial stenosis and advance into the proximal posterior tibial artery.  Selective imaging showed the remainder the posterior tibial artery be continuous down to the foot beyond the proximal lesion.  I then placed a 0.018 wire.  The posterior tibial artery and tibioperoneal trunk was treated with a 4 mm diameter by 8 cm length Lutonix drug-coated angioplasty balloon inflated to 10 atm for 1 minute.  About a 20 to 25% residual stenosis was present after angioplasty.  The SFA and popliteal lesions were then addressed first with mechanical thrombectomy to debulk the chronic thrombus from the stent and the lesion just below the previously placed stent.  3 passes with the roto-Rx device were performed in the left SFA and popliteal artery performing mechanical thrombectomy which resulted in a significant improvement in the thrombus burden and the residual stenosis was moderate within the stent and in the 70% range at the distal edge of the previously placed stent.  I then  elected to treat this with angioplasty.  A 5 mm diameter by 30 cm length Lutonix drug-coated angioplasty balloon was inflated to 12 atm for 1 minute from the above-knee popliteal artery up to the proximal SFA.  Completion imaging showed the in-stent stenosis to be mild in the less than 25% range but the distal edge of the previously placed stent still had greater than 50% residual stenosis.  I placed a 6 mm diameter by 7.5 cm length Viabahn stent from the distal edge of the previously placed stent down into the above-knee popliteal artery and postdilated this with a 6 mm balloon with excellent angiographic completion result and less than 10% residual stenosis. I elected to terminate the procedure. The sheath was removed and StarClose closure device was deployed in the right femoral artery with excellent hemostatic result. The patient was taken to the recovery room in stable condition having tolerated the procedure well.  Findings:  Aortogram:  This demonstrated mild to moderate left renal artery stenosis, mild right renal artery stenosis.  The aorta was mildly irregular but not highly stenotic.  The previously placed left common iliac stent was patent and there is mild disease in the left external iliac artery that did not appear flow-limiting.  The right common iliac artery had mild disease with more severe disease in the right external iliac artery in the moderate range.  This was not far above the access site, and would be easier to address from the left femoral approach and we treat his right leg in the near future             Left lower Extremity:  The common femoral artery was mildly diseased.  The profunda femoris artery was very diseased providing poor collateral flow likely creating some of his worsening symptoms.  The left SFA stent had several areas of moderate stenosis of greater than 50% throughout its course with a high-grade stenosis in the 90% range at the distal edge of the  previously placed stent just below Hunter's canal.  The disease tracked down into the above-knee popliteal artery for several centimeters and then the vessel normalized.  The below-knee popliteal artery was fairly normal.  The only runoff distally was through the posterior tibial artery which had a moderate stenosis in the 65 to 70% range at its origin.   Disposition: Patient was taken to the recovery room in stable condition having tolerated the procedure well.  Complications: None  Leotis Pain 07/23/2019 10:29 AM   This note was created with Dragon Medical transcription system. Any errors in dictation are purely unintentional.

## 2019-07-23 NOTE — H&P (Signed)
[facility] History & Physical Update  The patient was interviewed and re-examined.  The patient's previous History and Physical has been reviewed and is unchanged.  There is no change in the plan of care. We plan to proceed with the scheduled procedure.  Festus Barren, MD  07/23/2019, 8:17 AM

## 2019-07-24 ENCOUNTER — Telehealth (INDEPENDENT_AMBULATORY_CARE_PROVIDER_SITE_OTHER): Payer: Self-pay

## 2019-07-24 NOTE — Telephone Encounter (Signed)
Patient's wife called wanting clarification regarding the patient's paperwork. I explained that the patient is scheduled to have a covid test on 05/20/1 at the [facility] between 8-1 pm which is a drive up and his procedure is on 07/30/19 with a 8:00 am arrival time to the [facility]. Patient wanted to know does he have to have a test again and I advised that per [facility] health he does.

## 2019-07-26 ENCOUNTER — Telehealth (INDEPENDENT_AMBULATORY_CARE_PROVIDER_SITE_OTHER): Payer: Self-pay

## 2019-07-26 ENCOUNTER — Other Ambulatory Visit: Payer: Self-pay

## 2019-07-26 ENCOUNTER — Other Ambulatory Visit
Admission: RE | Admit: 2019-07-26 | Discharge: 2019-07-26 | Disposition: A | Discharge status: Home / Self Care | Payer: Commercial Managed Care - PPO | Source: Ambulatory Visit | Attending: Vascular Surgery | Admitting: Vascular Surgery

## 2019-07-26 DIAGNOSIS — Z01812 Encounter for preprocedural laboratory examination: Secondary | ICD-10-CM | POA: Diagnosis not present

## 2019-07-26 DIAGNOSIS — Z20822 Contact with and (suspected) exposure to covid-19: Secondary | ICD-10-CM | POA: Diagnosis not present

## 2019-07-26 LAB — SARS CORONAVIRUS 2 (TAT 6-24 HRS): SARS Coronavirus 2: NEGATIVE

## 2019-07-26 NOTE — Telephone Encounter (Signed)
The patient had significant blockage and this is likely related to reperfusion pain and swelling that can sometimes happen.  He should elevate his leg above his heart as much as possible today.  If he has compression socks, putting those on would be helpful. We should see some decrease in the redness and swelling overnight, she can call us and let us know how his leg is tomorrow morning

## 2019-07-26 NOTE — Telephone Encounter (Signed)
Patient wife has been made aware with medical advice and verbalized understanding 

## 2019-07-30 ENCOUNTER — Other Ambulatory Visit: Payer: Self-pay

## 2019-07-30 ENCOUNTER — Encounter
Admission: RE | Disposition: A | Discharge status: Home / Self Care | Payer: Self-pay | Source: Home / Self Care | Attending: Vascular Surgery

## 2019-07-30 ENCOUNTER — Ambulatory Visit
Admission: RE | Admit: 2019-07-30 | Discharge: 2019-07-30 | Disposition: A | Discharge status: Home / Self Care | Payer: Commercial Managed Care - PPO | Attending: Vascular Surgery | Admitting: Vascular Surgery

## 2019-07-30 ENCOUNTER — Other Ambulatory Visit (INDEPENDENT_AMBULATORY_CARE_PROVIDER_SITE_OTHER): Payer: Self-pay | Admitting: Nurse Practitioner

## 2019-07-30 ENCOUNTER — Encounter: Payer: Self-pay | Admitting: Vascular Surgery

## 2019-07-30 DIAGNOSIS — Z7902 Long term (current) use of antithrombotics/antiplatelets: Secondary | ICD-10-CM | POA: Diagnosis not present

## 2019-07-30 DIAGNOSIS — E1151 Type 2 diabetes mellitus with diabetic peripheral angiopathy without gangrene: Secondary | ICD-10-CM | POA: Diagnosis not present

## 2019-07-30 DIAGNOSIS — Z7984 Long term (current) use of oral hypoglycemic drugs: Secondary | ICD-10-CM | POA: Insufficient documentation

## 2019-07-30 DIAGNOSIS — I70211 Atherosclerosis of native arteries of extremities with intermittent claudication, right leg: Secondary | ICD-10-CM | POA: Diagnosis present

## 2019-07-30 DIAGNOSIS — Z79899 Other long term (current) drug therapy: Secondary | ICD-10-CM | POA: Diagnosis not present

## 2019-07-30 DIAGNOSIS — E782 Mixed hyperlipidemia: Secondary | ICD-10-CM | POA: Insufficient documentation

## 2019-07-30 DIAGNOSIS — I25810 Atherosclerosis of coronary artery bypass graft(s) without angina pectoris: Secondary | ICD-10-CM | POA: Insufficient documentation

## 2019-07-30 DIAGNOSIS — Z7982 Long term (current) use of aspirin: Secondary | ICD-10-CM | POA: Insufficient documentation

## 2019-07-30 DIAGNOSIS — Z87891 Personal history of nicotine dependence: Secondary | ICD-10-CM | POA: Insufficient documentation

## 2019-07-30 DIAGNOSIS — I110 Hypertensive heart disease with heart failure: Secondary | ICD-10-CM | POA: Diagnosis not present

## 2019-07-30 DIAGNOSIS — I70219 Atherosclerosis of native arteries of extremities with intermittent claudication, unspecified extremity: Secondary | ICD-10-CM

## 2019-07-30 DIAGNOSIS — I70213 Atherosclerosis of native arteries of extremities with intermittent claudication, bilateral legs: Secondary | ICD-10-CM | POA: Diagnosis not present

## 2019-07-30 DIAGNOSIS — I509 Heart failure, unspecified: Secondary | ICD-10-CM | POA: Diagnosis not present

## 2019-07-30 HISTORY — PX: LOWER EXTREMITY ANGIOGRAPHY: CATH118251

## 2019-07-30 LAB — BUN: BUN: 17 mg/dL (ref 8–23)

## 2019-07-30 LAB — GLUCOSE, CAPILLARY: Glucose-Capillary: 241 mg/dL — ABNORMAL HIGH (ref 70–99)

## 2019-07-30 LAB — CREATININE, SERUM
Creatinine, Ser: 0.91 mg/dL (ref 0.61–1.24)
GFR calc Af Amer: >60 mL/min (ref >60)
GFR calc non Af Amer: >60 mL/min (ref >60)

## 2019-07-30 SURGERY — LOWER EXTREMITY ANGIOGRAPHY
Anesthesia: Moderate Sedation | Site: Leg Lower | Laterality: Right

## 2019-07-30 MED ORDER — HEPARIN SODIUM (PORCINE) 1000 UNIT/ML IJ SOLN
INTRAMUSCULAR | Status: AC
  Filled 2019-07-30: qty 1

## 2019-07-30 MED ORDER — FENTANYL CITRATE (PF) 100 MCG/2ML IJ SOLN
INTRAMUSCULAR | Status: AC
  Filled 2019-07-30: qty 4

## 2019-07-30 MED ORDER — METHYLPREDNISOLONE SODIUM SUCC 125 MG IJ SOLR: 125.0000 mg | Freq: Once | INTRAMUSCULAR | Status: DC | PRN

## 2019-07-30 MED ORDER — FAMOTIDINE 20 MG PO TABS: 40.0000 mg | ORAL_TABLET | Freq: Once | ORAL | Status: DC | PRN

## 2019-07-30 MED ORDER — HYDROMORPHONE HCL 1 MG/ML IJ SOLN: 1.0000 mg | Freq: Once | INTRAMUSCULAR | Status: DC | PRN

## 2019-07-30 MED ORDER — HEPARIN SODIUM (PORCINE) 1000 UNIT/ML IJ SOLN
INTRAMUSCULAR | Status: DC | PRN
  Administered 2019-07-30: 5000 [IU] via INTRAVENOUS

## 2019-07-30 MED ORDER — SODIUM CHLORIDE 0.9 % IV SOLN: INTRAVENOUS | Status: DC

## 2019-07-30 MED ORDER — DIPHENHYDRAMINE HCL 50 MG/ML IJ SOLN: 50.0000 mg | Freq: Once | INTRAMUSCULAR | Status: DC | PRN

## 2019-07-30 MED ORDER — CEFAZOLIN SODIUM-DEXTROSE 2-4 GM/100ML-% IV SOLN
INTRAVENOUS | Status: AC
  Administered 2019-07-30: 2 g via INTRAVENOUS
  Filled 2019-07-30: qty 100

## 2019-07-30 MED ORDER — IODIXANOL 320 MG/ML IV SOLN
INTRAVENOUS | Status: DC | PRN
  Administered 2019-07-30: 70 mL via INTRA_ARTERIAL

## 2019-07-30 MED ORDER — MIDAZOLAM HCL 2 MG/2ML IJ SOLN
INTRAMUSCULAR | Status: DC | PRN
  Administered 2019-07-30: 1 mg via INTRAVENOUS
  Administered 2019-07-30: 2 mg via INTRAVENOUS
  Administered 2019-07-30: 1 mg via INTRAVENOUS

## 2019-07-30 MED ORDER — CEFAZOLIN SODIUM-DEXTROSE 2-4 GM/100ML-% IV SOLN: 2.0000 g | Freq: Once | INTRAVENOUS | Status: AC

## 2019-07-30 MED ORDER — MIDAZOLAM HCL 2 MG/ML PO SYRP: 8.0000 mg | ORAL_SOLUTION | Freq: Once | ORAL | Status: DC | PRN

## 2019-07-30 MED ORDER — ONDANSETRON HCL 4 MG/2ML IJ SOLN: 4.0000 mg | Freq: Four times a day (QID) | INTRAMUSCULAR | Status: DC | PRN

## 2019-07-30 MED ORDER — FENTANYL CITRATE (PF) 100 MCG/2ML IJ SOLN
INTRAMUSCULAR | Status: DC | PRN
  Administered 2019-07-30: 50 ug via INTRAVENOUS
  Administered 2019-07-30 (×3): 25 ug via INTRAVENOUS

## 2019-07-30 MED ORDER — MIDAZOLAM HCL 5 MG/5ML IJ SOLN
INTRAMUSCULAR | Status: AC
  Filled 2019-07-30: qty 5

## 2019-07-30 SURGICAL SUPPLY — 21 items
BALLN LUTONIX 018 5X300X130 (BALLOONS) ×3
BALLN LUTONIX 018 6X100X130 (BALLOONS) ×3
BALLN ULTRVRSE 3X150X150 (BALLOONS) ×3
BALLN ULTRVRSE 5X220X150 (BALLOONS) ×3
BALLOON LUTONIX 018 5X300X130 (BALLOONS) IMPLANT
BALLOON LUTONIX 018 6X100X130 (BALLOONS) IMPLANT
BALLOON ULTRVRSE 3X150X150 (BALLOONS) IMPLANT
BALLOON ULTRVRSE 5X220X150 (BALLOONS) IMPLANT
CATH ANGIO 5F PIGTAIL 65CM (CATHETERS) ×2 IMPLANT
CATH ROTAREX 135 6FR (CATHETERS) ×2 IMPLANT
CATH VERT 5FR 125CM (CATHETERS) ×2 IMPLANT
DEVICE PRESTO INFLATION (MISCELLANEOUS) ×2 IMPLANT
DEVICE STARCLOSE SE CLOSURE (Vascular Products) ×2 IMPLANT
GLIDEWIRE ADV .035X260CM (WIRE) ×2 IMPLANT
PACK ANGIOGRAPHY (CUSTOM PROCEDURE TRAY) ×2 IMPLANT
SHEATH ANL2 6FRX45 HC (SHEATH) ×2 IMPLANT
SHEATH BRITE TIP 5FRX11 (SHEATH) ×2 IMPLANT
STENT VIABAHN 6X150X120 (Permanent Stent) ×2 IMPLANT
SYR MEDRAD MARK 7 150ML (SYRINGE) ×2 IMPLANT
TUBING CONTRAST HIGH PRESS 72 (TUBING) ×2 IMPLANT
WIRE J 3MM .035X145CM (WIRE) ×2 IMPLANT

## 2019-07-30 NOTE — Op Note (Signed)
[facility]  Percutaneous Study/Intervention Procedural Note   Date of Surgery: 07/30/2019  Surgeon(s):Zebulen Simonis    Assistants:none  Pre-operative Diagnosis: PAD with claudication bilateral lower extremities  Post-operative diagnosis:  Same  Procedure(s) Performed:             1.  Ultrasound guidance for vascular access left femoral artery             2.  Catheter placement into right common femoral artery from left femoral approach             3.  Aortogram and selective right lower extremity angiogram             4.  Percutaneous transluminal angioplasty of right posterior tibial artery and tibioperoneal trunk with 3 mm diameter by 15 cm length angioplasty balloon             5.   Mechanical thrombectomy to the right SFA and popliteal arteries with the roto-Rex device  6.  Percutaneous transluminal angioplasty of the right SFA and popliteal arteries with 5 mm diameter by 30 cm length Lutonix drug-coated angioplasty balloon and 6 mm diameter by 10 cm length Lutonix drug-coated angioplasty balloon proximally  7.  Viabahn stent placement to the right popliteal artery with 6 mm diameter by 15 cm length stent for greater than 50% residual stenosis after above procedures             8.  StarClose closure device left femoral artery  EBL: 10 cc  Contrast: 70 cc  Fluoro Time: 5.9 minutes  Moderate Conscious Sedation Time: approximately 40 minutes using 4 mg of Versed and 125 mcg of Fentanyl              Indications:  Patient is a 64 y.o.male with peripheral arterial disease status post intervention last year.  He has claudication symptoms in both legs and has already undergone left leg intervention. The patient has noninvasive study showing a drop in his ABIs bilaterally. The patient is brought in for angiography for further evaluation and potential treatment.  Due to the limb threatening nature of the situation, angiogram was performed for attempted limb salvage. The  patient is aware that if the procedure fails, amputation would be expected.  The patient also understands that even with successful revascularization, amputation may still be required due to the severity of the situation.  Risks and benefits are discussed and informed consent is obtained.   Procedure:  The patient was identified and appropriate procedural time out was performed.  The patient was then placed supine on the table and prepped and draped in the usual sterile fashion. Moderate conscious sedation was administered during a face to face encounter with the patient throughout the procedure with my supervision of the RN administering medicines and monitoring the patient's vital signs, pulse oximetry, telemetry and mental status throughout from the start of the procedure until the patient was taken to the recovery room. Ultrasound was used to evaluate the left common femoral artery.  It was patent .  A digital ultrasound image was acquired.  A Seldinger needle was used to access the left common femoral artery under direct ultrasound guidance and a permanent image was performed.  A 0.035 J wire was advanced without resistance and a 5Fr sheath was placed.  Pigtail catheter was placed into the aorta and an AP aortogram was performed. This demonstrated mild to moderate left renal artery stenosis and mild right renal artery stenosis.  The aorta  was patent.  The right iliac stenosis today appeared to be less than 50% and not quite as severe as it had on the last study.  The left iliac stent was patent and there is minimal left iliac artery disease. I then crossed the aortic bifurcation and advanced to the right femoral head. Selective right lower extremity angiogram was then performed. This demonstrated multiple areas of in-stent stenosis.  The worst of which was in the 90% range of the mid SFA.  Below the previously placed stent there were 2 areas of popliteal stenosis 1 about 70% and 1 about 85%.  The distal  popliteal artery normalized and then the tibioperoneal trunk and proximal posterior tibial artery had moderate stenosis in the 60 to 70% range.  The vessel then normalized and was continuous to the foot and was the only runoff distally.  It was felt that it was in the patient's best interest to proceed with intervention after these images to avoid a second procedure and a larger amount of contrast and fluoroscopy based off of the findings from the initial angiogram. The patient was systemically heparinized and a 6 [place] Ansell sheath was then placed over the [employer] wire. I then used a Kumpe catheter and the advantage wire to navigate through the SFA and popliteal stenoses and get down in the distal popliteal artery.  I then exchanged for a 0.018 wire and cross the posterior tibial and tibioperoneal trunk stenosis parking the wire in the foot.  The posterior tibial artery and tibioperoneal trunk were treated with a 3 mm diameter by 15 cm length angioplasty balloon inflated atmospheres for 1 minute.  I elected to debulk the chronic thrombus within the old stents and above-knee popliteal artery with the roto-Rx thrombectomy device.  2 passes were made with the roto-Rx device performing mechanical thrombectomy to the right SFA and popliteal arteries.  After thrombectomy, angioplasty was done with a 5 mm diameter by 30 cm length Lutonix drug-coated angioplasty balloon from the below-knee popliteal artery up to the proximal to mid SFA.  The more proximal portion was treated with a 6 mm diameter by 10 cm length Lutonix drug-coated angioplasty balloon inflated to 8 atm for 1 minute.  The SFA stents looked good with less than 20% residual stenosis, but the popliteal artery had greater than 50% residual stenosis so I elected to place a stent.  A 6 mm diameter by 15 cm length Viabahn stent was deployed from the distal edge of the previously placed stent down to the mid popliteal artery and postdilated with a 5 mm  balloon with excellent angiographic completion result and less than 10% residual stenosis. I elected to terminate the procedure. The sheath was removed and StarClose closure device was deployed in the left femoral artery with excellent hemostatic result. The patient was taken to the recovery room in stable condition having tolerated the procedure well.  Findings:               Aortogram:  This demonstrated mild to moderate left renal artery stenosis and mild right renal artery stenosis.  The aorta was patent.  The right iliac stenosis today appeared to be less than 50% and not quite as severe as it had on the last study.  The left iliac stent was patent and there is minimal left iliac artery disease.             Right lower Extremity:  This demonstrated multiple areas of in-stent stenosis.  The worst of  which was in the 90% range of the mid SFA.  Below the previously placed stent there were 2 areas of popliteal stenosis 1 about 70% and 1 about 85%.  The distal popliteal artery normalized and then the tibioperoneal trunk and proximal posterior tibial artery had moderate stenosis in the 60 to 70% range.  The vessel then normalized and was continuous to the foot and was the only runoff distally.   Disposition: Patient was taken to the recovery room in stable condition having tolerated the procedure well.  Complications: None  Leotis Pain 07/30/2019 10:33 AM   This note was created with Dragon Medical transcription system. Any errors in dictation are purely unintentional.

## 2019-07-30 NOTE — H&P (Signed)
[facility] History & Physical Update  The patient was interviewed and re-examined.  The patient's previous History and Physical has been reviewed and is unchanged.  There is no change in the plan of care. We plan to proceed with the scheduled procedure.  Festus Barren, MD  07/30/2019, 8:11 AM

## 2019-08-13 ENCOUNTER — Telehealth (INDEPENDENT_AMBULATORY_CARE_PROVIDER_SITE_OTHER): Payer: Self-pay

## 2019-08-13 ENCOUNTER — Other Ambulatory Visit (INDEPENDENT_AMBULATORY_CARE_PROVIDER_SITE_OTHER): Payer: Self-pay | Admitting: Vascular Surgery

## 2019-08-13 DIAGNOSIS — I70213 Atherosclerosis of native arteries of extremities with intermittent claudication, bilateral legs: Secondary | ICD-10-CM

## 2019-08-13 DIAGNOSIS — Z95820 Peripheral vascular angioplasty status with implants and grafts: Secondary | ICD-10-CM

## 2019-08-13 NOTE — Telephone Encounter (Signed)
We can move up his follow up with bilateral ABIs

## 2019-08-16 ENCOUNTER — Ambulatory Visit (INDEPENDENT_AMBULATORY_CARE_PROVIDER_SITE_OTHER): Payer: Commercial Managed Care - PPO

## 2019-08-16 ENCOUNTER — Ambulatory Visit (INDEPENDENT_AMBULATORY_CARE_PROVIDER_SITE_OTHER): Payer: Commercial Managed Care - PPO | Admitting: Nurse Practitioner

## 2019-08-16 ENCOUNTER — Encounter (INDEPENDENT_AMBULATORY_CARE_PROVIDER_SITE_OTHER): Payer: Self-pay | Admitting: Nurse Practitioner

## 2019-08-16 ENCOUNTER — Other Ambulatory Visit: Payer: Self-pay

## 2019-08-16 VITALS — BP 124/76 | HR 71 | Ht 67.0 in | Wt 174.0 lb

## 2019-08-16 DIAGNOSIS — I70213 Atherosclerosis of native arteries of extremities with intermittent claudication, bilateral legs: Secondary | ICD-10-CM

## 2019-08-16 DIAGNOSIS — Z95820 Peripheral vascular angioplasty status with implants and grafts: Secondary | ICD-10-CM | POA: Diagnosis not present

## 2019-08-16 DIAGNOSIS — M109 Gout, unspecified: Secondary | ICD-10-CM | POA: Diagnosis not present

## 2019-08-16 DIAGNOSIS — E785 Hyperlipidemia, unspecified: Secondary | ICD-10-CM

## 2019-08-16 DIAGNOSIS — E1169 Type 2 diabetes mellitus with other specified complication: Secondary | ICD-10-CM | POA: Diagnosis not present

## 2019-08-16 DIAGNOSIS — I10 Essential (primary) hypertension: Secondary | ICD-10-CM

## 2019-08-17 ENCOUNTER — Encounter (INDEPENDENT_AMBULATORY_CARE_PROVIDER_SITE_OTHER): Payer: Self-pay | Admitting: Nurse Practitioner

## 2019-08-17 NOTE — Progress Notes (Signed)
Subjective:    Patient ID: Francisco Medina, male    DOB: 1955-05-11, 64 y.o.   MRN: [id number] Chief Complaint  Patient presents with  . Follow-up    5 wk [facility] post BIL LE angio    The patient returns to the office for followup and review status post angiogram with intervention. The patient notes improvement in the lower extremity symptoms. No interval shortening of the patient's claudication distance or rest pain symptoms. Previous wounds have now healed.  No new ulcers or wounds have occurred since the last visit.  However, the patient notes that with his left lower extremity he has had some bruising in the left medial ankle.  There was also some swelling in that area.  The patient and his wife are concerned because that area is very sore and tender to the touch.  This is been ongoing since his angiogram.  The patient also describes shooting pains going up his legs that are not necessarily consistent with claudication pain.  He denies any rest pain.  The patient underwent intervention on 07/23/2019 on the left lower extremity and 07/30/2019 on the right lower extremity.  On the left lower extremity the patient underwent:  Procedure(s) Performed: 1. Ultrasound guidance for vascular access right femoral artery 2. Catheter placement into left common femoral artery from right femoral approach 3. Aortogram and selective left lower extremity angiogram including selective imaging of the left posterior tibial artery 4. Percutaneous transluminal angioplasty of the left posterior tibial artery and tibioperoneal trunk with 4 mm diameter by 8 cm length Lutonix drug-coated angioplasty balloon 5.  Mechanical thrombectomy with the roto-Rx device to the left SFA and above-knee popliteal arteries             6.  Percutaneous transluminal angioplasty of the left SFA and above-knee popliteal artery with 5 mm diameter by 30 cm length Lutonix  drug-coated angioplasty balloon             7.  Viabahn stent placement to the distal left SFA and above-knee popliteal artery with 6 mm diameter by 7.5 cm length stent for greater than 50% residual stenosis after above procedures 8. StarClose closure device right femoral artery  The right lower extremity on 07/30/2019, underwent:  Procedure(s) Performed: 1. Ultrasound guidance for vascular access left femoral artery 2. Catheter placement into right common femoral artery from left femoral approach 3. Aortogram and selective right lower extremity angiogram 4. Percutaneous transluminal angioplasty of right posterior tibial artery and tibioperoneal trunk with 3 mm diameter by 15 cm length angioplasty balloon 5.  Mechanical thrombectomy to the right SFA and popliteal arteries with the roto-Rex device             6.  Percutaneous transluminal angioplasty of the right SFA and popliteal arteries with 5 mm diameter by 30 cm length Lutonix drug-coated angioplasty balloon and 6 mm diameter by 10 cm length Lutonix drug-coated angioplasty balloon proximally             7.  Viabahn stent placement to the right popliteal artery with 6 mm diameter by 15 cm length stent for greater than 50% residual stenosis after above procedures 8. StarClose closure device left femoral artery  The patient denies amaurosis fugax or recent TIA symptoms. There are no recent neurological changes noted. The patient denies history of DVT, PE or superficial thrombophlebitis. The patient denies recent episodes of angina or shortness of breath.   ABI's Rt=1.05 and Lt=1.06  (previous ABI's Rt=0.73 and  Lt=0.68) Duplex US of the right tibial arteries show an absent anterior tibial artery with biphasic tibial artery waveforms with good toe tracings.  The left lower extremity also shows an absent anterior tibial artery however with triphasic waveforms  and good toe waveforms.   Review of Systems  Cardiovascular: Positive for leg swelling.  Musculoskeletal: Positive for arthralgias.  Hematological: Bruises/bleeds easily.  All other systems reviewed and are negative.      Objective:   Physical Exam Vitals reviewed.  Cardiovascular:     Rate and Rhythm: Normal rate and regular rhythm.     Pulses:          Dorsalis pedis pulses are 1+ on the right side and 1+ on the left side.       Posterior tibial pulses are 1+ on the right side and 1+ on the left side.     Heart sounds: Normal heart sounds.     Comments: Bruising present left medial ankle Pulmonary:     Effort: Pulmonary effort is normal.     Breath sounds: Normal breath sounds.  Musculoskeletal:     Left lower leg: Edema present.  Neurological:     Mental Status: He is alert and oriented to person, place, and time.  Psychiatric:        Mood and Affect: Mood normal.        Behavior: Behavior normal.        Thought Content: Thought content normal.        Judgment: Judgment normal.     BP 124/76   Pulse 71   Ht 5\' 7"  (1.702 m)   Wt 174 lb (78.9 kg)   BMI 27.25 kg/m   Past Medical History:  Diagnosis Date  . CHF (congestive heart failure) (HCC)   . Hypertension     Social History   Socioeconomic History  . Marital status: Married    Spouse name: Not on file  . Number of children: Not on file  . Years of education: Not on file  . Highest education level: Not on file  Occupational History  . Not on file  Tobacco Use  . Smoking status: Former Smoker    Packs/day: 1.00    Years: 10.00    Pack years: 10.00  . Smokeless tobacco: Never Used  Vaping Use  . Vaping Use: Never used  Substance and Sexual Activity  . Alcohol use: Not Currently  . Drug use: Not Currently  . Sexual activity: Not on file  Other Topics Concern  . Not on file  Social History Narrative  . Not on file   Social Determinants of Health   Financial Resource Strain:   . Difficulty  of Paying Living Expenses:   Food Insecurity:   . Worried About in the Last Year:   . Programme researcher, broadcasting/film/video in the Last Year:   Transportation Needs:   . Barista (Medical):   Freight forwarder Lack of Transportation (Non-Medical):   Physical Activity:   . Days of Exercise per Week:   . Minutes of Exercise per Session:   Stress:   . Feeling of Stress :   Social Connections:   . Frequency of Communication with Friends and Family:   . Frequency of Social Gatherings with Friends and Family:   . Attends Religious Services:   . Active Member of Clubs or Organizations:   . Attends [employer] Meetings:   Banker Marital Status:   Intimate Partner Violence:   .  Fear of Current or Ex-Partner:   . Emotionally Abused:   [employer] Physically Abused:   . Sexually Abused:     Past Surgical History:  Procedure Laterality Date  . LEFT HEART CATH AND CORS/GRAFTS ANGIOGRAPHY N/A 04/17/2018   Procedure: LEFT HEART CATH AND CORS/GRAFTS ANGIOGRAPHY;  Surgeon: Dalia Heading, MD;  Location: [facility];  Service: Cardiovascular;  Laterality: N/A;  . LOWER EXTREMITY ANGIOGRAPHY Right 12/11/2018   Procedure: LOWER EXTREMITY ANGIOGRAPHY;  Surgeon: Annice Needy, MD;  Location: [facility];  Service: Cardiovascular;  Laterality: Right;  . LOWER EXTREMITY ANGIOGRAPHY Left 12/18/2018   Procedure: LOWER EXTREMITY ANGIOGRAPHY;  Surgeon: Annice Needy, MD;  Location: [facility];  Service: Cardiovascular;  Laterality: Left;  . LOWER EXTREMITY ANGIOGRAPHY Left 07/23/2019   Procedure: LOWER EXTREMITY ANGIOGRAPHY;  Surgeon: Annice Needy, MD;  Location: [facility];  Service: Cardiovascular;  Laterality: Left;  . LOWER EXTREMITY ANGIOGRAPHY Right 07/30/2019   Procedure: LOWER EXTREMITY ANGIOGRAPHY;  Surgeon: Annice Needy, MD;  Location: [facility];  Service: Cardiovascular;  Laterality: Right;    Family History  Family history unknown: Yes    Allergies    Allergen Reactions  . Atorvastatin Other (See Comments)    Other reaction(s): Muscle Pain       Assessment & Plan:   1. Atherosclerosis of native artery of both lower extremities with intermittent claudication (HCC)  Recommend:  The patient has evidence of atherosclerosis of the lower extremities with claudication.  The patient does not voice lifestyle limiting changes at this point in time.  Noninvasive studies do not suggest clinically significant change.  No invasive studies, angiography or surgery at this time The patient should continue walking and begin a more formal exercise program.  The patient should continue antiplatelet therapy and aggressive treatment of the lipid abnormalities  No changes in the patient's medications at this time  The patient should continue wearing graduated compression socks 10-15 mmHg strength to control the mild edema.    2. Acute gout of left ankle, unspecified cause Review of the procedural images show no perforation or extravasation of contrast.  Noninvasive studies today show open and patent stent.  The patient has a previous history of gout.  Due to the stress on the body from multiple procedures in addition to multiple doses of contrast, this possibly could have caused a gout-like reaction.  This may also be excessive inflammation due to the nature of the intervention that was necessary.  We have placed send the patient in a prescription for Protonix, to ensure that there is no GI discomfort.  We will also send the patient a Medrol Dosepak to treat the possible gout and inflammation.  Finally, to help with the pain we will also send in tramadol as well.  Instructed the patient to contact us in a week to see how his pain and discomfort are doing.  If they have subsided and inflammation and/or gout were the likely causes, if not we will have the patient return for further follow-up and work-up.  3. Hyperlipidemia associated with type 2 diabetes  mellitus (HCC) Continue statin as ordered and reviewed, no changes at this time   4. Benign essential hypertension Continue antihypertensive medications as already ordered, these medications have been reviewed and there are no changes at this time.    Current Outpatient Medications on File Prior to Visit  Medication Sig Dispense Refill  . acetaminophen (TYLENOL) 325 MG tablet  Take by mouth.    [employer] aspirin 325 MG tablet Take 325 mg by mouth at bedtime.    [employer] atorvastatin (LIPITOR) 80 MG tablet Take 80 mg by mouth daily.    . carvedilol (COREG) 6.25 MG tablet Take 6.25 mg by mouth 2 (two) times daily.    . clopidogrel (PLAVIX) 75 MG tablet Take 75 mg by mouth daily.    . colchicine 0.6 MG tablet Take 0.6 mg by mouth daily as needed (gout).     Tery Sanfilippo Sodium (DSS) 100 MG CAPS Take by mouth.    . fluticasone (FLONASE) 50 MCG/ACT nasal spray Place 2 sprays into both nostrils daily as needed for allergies.    . furosemide (LASIX) 40 MG tablet Take 40 mg by mouth daily.    [employer] glipiZIDE (GLUCOTROL XL) 5 MG 24 hr tablet Take 5 mg by mouth daily.    . hydrochlorothiazide (HYDRODIURIL) 25 MG tablet Take 25 mg by mouth daily.    . isosorbide mononitrate (IMDUR) 30 MG 24 hr tablet Take 30 mg by mouth daily.    [employer] latanoprost (XALATAN) 0.005 % ophthalmic solution Place 1 drop into both eyes at bedtime.    [employer] losartan (COZAAR) 100 MG tablet Take 100 mg by mouth daily.    . metFORMIN (GLUCOPHAGE) 1000 MG tablet Take 1,000 mg by mouth 2 (two) times daily with a meal.    . nitroGLYCERIN 2.5 MG CR capsule     . potassium chloride (K-DUR) 10 MEQ tablet Take 10 mEq by mouth daily.    . rosuvastatin (CRESTOR) 20 MG tablet Take by mouth.    . timolol (TIMOPTIC) 0.5 % ophthalmic solution Place 1 drop into both eyes every morning.    . nitroGLYCERIN (NITROSTAT) 0.4 MG SL tablet Place 0.4 mg under the tongue every 5 (five) minutes as needed for chest pain.      No current facility-administered medications on  file prior to visit.    There are no Patient Instructions on file for this visit. No follow-ups on file.   Georgiana Spinner, NP

## 2019-08-27 ENCOUNTER — Telehealth (INDEPENDENT_AMBULATORY_CARE_PROVIDER_SITE_OTHER): Payer: Self-pay

## 2019-08-27 NOTE — Telephone Encounter (Signed)
Patient is scheduled to come in on 08/28/19 at 8:30 am.

## 2019-08-27 NOTE — Telephone Encounter (Signed)
Let's bring him in for a pseudoaneurysm study on the L LE

## 2019-08-27 NOTE — Telephone Encounter (Signed)
Patient's wife called and the patient has completed his steroids and his leg has turned yellow again with bruising and tender to touch. Please advise.

## 2019-08-28 ENCOUNTER — Encounter (INDEPENDENT_AMBULATORY_CARE_PROVIDER_SITE_OTHER): Payer: Self-pay | Admitting: Vascular Surgery

## 2019-08-28 ENCOUNTER — Other Ambulatory Visit: Payer: Self-pay

## 2019-08-28 ENCOUNTER — Ambulatory Visit (INDEPENDENT_AMBULATORY_CARE_PROVIDER_SITE_OTHER): Payer: Commercial Managed Care - PPO | Admitting: Vascular Surgery

## 2019-08-28 ENCOUNTER — Ambulatory Visit (INDEPENDENT_AMBULATORY_CARE_PROVIDER_SITE_OTHER): Payer: Commercial Managed Care - PPO

## 2019-08-28 ENCOUNTER — Other Ambulatory Visit (INDEPENDENT_AMBULATORY_CARE_PROVIDER_SITE_OTHER): Payer: Self-pay | Admitting: Nurse Practitioner

## 2019-08-28 VITALS — BP 108/65 | HR 75 | Ht 67.0 in | Wt 175.0 lb

## 2019-08-28 DIAGNOSIS — E782 Mixed hyperlipidemia: Secondary | ICD-10-CM

## 2019-08-28 DIAGNOSIS — I10 Essential (primary) hypertension: Secondary | ICD-10-CM | POA: Diagnosis not present

## 2019-08-28 DIAGNOSIS — M79605 Pain in left leg: Secondary | ICD-10-CM

## 2019-08-28 DIAGNOSIS — M79609 Pain in unspecified limb: Secondary | ICD-10-CM | POA: Insufficient documentation

## 2019-08-28 DIAGNOSIS — I25810 Atherosclerosis of coronary artery bypass graft(s) without angina pectoris: Secondary | ICD-10-CM | POA: Diagnosis not present

## 2019-08-28 DIAGNOSIS — L819 Disorder of pigmentation, unspecified: Secondary | ICD-10-CM

## 2019-08-28 DIAGNOSIS — E119 Type 2 diabetes mellitus without complications: Secondary | ICD-10-CM

## 2019-08-28 DIAGNOSIS — I70213 Atherosclerosis of native arteries of extremities with intermittent claudication, bilateral legs: Secondary | ICD-10-CM

## 2019-08-28 NOTE — Assessment & Plan Note (Signed)
Status post intervention bilaterally.  No evidence of pseudoaneurysm on exam today with duplex.  His perfusion was recently checked and was back to normal.  It appears his current pain is due to a ruptured Baker's cyst in the left calf.  We will recheck his perfusion in about 3 months.  Continue current medical regimen.

## 2019-08-28 NOTE — Assessment & Plan Note (Signed)
Duplex today shows no evidence of pseudoaneurysm after intervention on the left leg.  His prominent finding on duplex today is of what appears to be a ruptured Baker's cyst behind the left knee and tracking down into the mid calf on duplex.  We discussed the use of leg elevation and compression.  We discussed the use of anti-inflammatories and the fact that this would likely take several weeks to resolve.  He voices his understanding.  He will be returning for follow-up in 3 months for his PAD but can contact us sooner with any problems in the interim.

## 2019-08-28 NOTE — Progress Notes (Signed)
MRN : [id number]  Francisco Medina is a 64 y.o. (09/24/1955) male who presents with chief complaint of  Chief Complaint  Patient presents with  . Follow-up    U/S follow up  .  History of Present Illness: Patient returns today in follow up of his persistent left leg pain.  He got some temporary relief with a short course of steroids but after the stopped the pain returned.  This is associated with swelling.  The right leg is doing great he has no complaints.  His perfusion was recently checked and found to be normal.  Concerns for possible pseudoaneurysm or other cause of his left leg pain and swelling were evaluated today. Duplex today shows no evidence of pseudoaneurysm after intervention on the left leg.  His prominent finding on duplex today is of what appears to be a ruptured Baker's cyst behind the left knee and tracking down into the mid calf on duplex.   Current Outpatient Medications  Medication Sig Dispense Refill  . acetaminophen (TYLENOL) 325 MG tablet Take by mouth.    Marland Kitchen aspirin 325 MG tablet Take 325 mg by mouth at bedtime.    Marland Kitchen atorvastatin (LIPITOR) 80 MG tablet Take 80 mg by mouth daily.    . carvedilol (COREG) 6.25 MG tablet Take 6.25 mg by mouth 2 (two) times daily.    . clopidogrel (PLAVIX) 75 MG tablet Take 75 mg by mouth daily.    . colchicine 0.6 MG tablet Take 0.6 mg by mouth daily as needed (gout).     Tery Sanfilippo Sodium (DSS) 100 MG CAPS Take by mouth.    . fluticasone (FLONASE) 50 MCG/ACT nasal spray Place 2 sprays into both nostrils daily as needed for allergies.    . furosemide (LASIX) 40 MG tablet Take 40 mg by mouth daily.    Marland Kitchen glipiZIDE (GLUCOTROL XL) 5 MG 24 hr tablet Take 5 mg by mouth daily.    . hydrochlorothiazide (HYDRODIURIL) 25 MG tablet Take 25 mg by mouth daily.    . isosorbide mononitrate (IMDUR) 30 MG 24 hr tablet Take 30 mg by mouth daily.    Marland Kitchen latanoprost (XALATAN) 0.005 % ophthalmic solution Place 1 drop into both eyes at bedtime.    Marland Kitchen  losartan (COZAAR) 100 MG tablet Take 100 mg by mouth daily.    . metFORMIN (GLUCOPHAGE) 1000 MG tablet Take 1,000 mg by mouth 2 (two) times daily with a meal.    . nitroGLYCERIN 2.5 MG CR capsule     . potassium chloride (K-DUR) 10 MEQ tablet Take 10 mEq by mouth daily.    . rosuvastatin (CRESTOR) 20 MG tablet Take by mouth.    . timolol (TIMOPTIC) 0.5 % ophthalmic solution Place 1 drop into both eyes every morning.    . nitroGLYCERIN (NITROSTAT) 0.4 MG SL tablet Place 0.4 mg under the tongue every 5 (five) minutes as needed for chest pain.      No current facility-administered medications for this visit.    Past Medical History:  Diagnosis Date  . CHF (congestive heart failure) (HCC)   . Hypertension     Past Surgical History:  Procedure Laterality Date  . LEFT HEART CATH AND CORS/GRAFTS ANGIOGRAPHY N/A 04/17/2018   Procedure: LEFT HEART CATH AND CORS/GRAFTS ANGIOGRAPHY;  Surgeon: Dalia Heading, MD;  Location: [facility];  Service: Cardiovascular;  Laterality: N/A;  . LOWER EXTREMITY ANGIOGRAPHY Right 12/11/2018   Procedure: LOWER EXTREMITY ANGIOGRAPHY;  Surgeon: Annice Needy, MD;  Location: [facility];  Service: Cardiovascular;  Laterality: Right;  . LOWER EXTREMITY ANGIOGRAPHY Left 12/18/2018   Procedure: LOWER EXTREMITY ANGIOGRAPHY;  Surgeon: Annice Needyew, Sinia Antosh S, MD;  Location: [facility];  Service: Cardiovascular;  Laterality: Left;  . LOWER EXTREMITY ANGIOGRAPHY Left 07/23/2019   Procedure: LOWER EXTREMITY ANGIOGRAPHY;  Surgeon: Annice Needyew, Julie Paolini S, MD;  Location: [facility];  Service: Cardiovascular;  Laterality: Left;  . LOWER EXTREMITY ANGIOGRAPHY Right 07/30/2019   Procedure: LOWER EXTREMITY ANGIOGRAPHY;  Surgeon: Annice Needyew, Gerrard Crystal S, MD;  Location: [facility];  Service: Cardiovascular;  Laterality: Right;     Social History   Tobacco Use  . Smoking status: Former Smoker    Packs/day: 1.00    Years: 10.00    Pack years: 10.00  . Smokeless  tobacco: Never Used  Vaping Use  . Vaping Use: Never used  Substance Use Topics  . Alcohol use: Not Currently  . Drug use: Not Currently       Family History  Family history unknown: Yes     Allergies  Allergen Reactions  . Atorvastatin Other (See Comments)    Other reaction(s): Muscle Pain     REVIEW OF SYSTEMS (Negative unless checked)  Constitutional: [] Weight loss  [] Fever  [] Chills Cardiac: [] Chest pain   [] Chest pressure   [] Palpitations   [] Shortness of breath when laying flat   [] Shortness of breath at rest   [x] Shortness of breath with exertion. Vascular:  [x] Pain in legs with walking   [] Pain in legs at rest   [] Pain in legs when laying flat   [x] Claudication   [] Pain in feet when walking  [] Pain in feet at rest  [] Pain in feet when laying flat   [] History of DVT   [] Phlebitis   [x] Swelling in legs   [] Varicose veins   [] Non-healing ulcers Pulmonary:   [] Uses home oxygen   [] Productive cough   [] Hemoptysis   [] Wheeze  [] COPD   [] Asthma Neurologic:  [] Dizziness  [] Blackouts   [] Seizures   [] History of stroke   [] History of TIA  [] Aphasia   [] Temporary blindness   [] Dysphagia   [] Weakness or numbness in arms   [] Weakness or numbness in legs Musculoskeletal:  [x] Arthritis   [] Joint swelling   [x] Joint pain   [] Low back pain Hematologic:  [] Easy bruising  [] Easy bleeding   [] Hypercoagulable state   [] Anemic   Gastrointestinal:  [] Blood in stool   [] Vomiting blood  [] Gastroesophageal reflux/heartburn   [] Abdominal pain Genitourinary:  [] Chronic kidney disease   [] Difficult urination  [] Frequent urination  [] Burning with urination   [] Hematuria Skin:  [] Rashes   [] Ulcers   [] Wounds Psychological:  [] History of anxiety   []  History of major depression.  Physical Examination  BP 108/65   Pulse 75   Ht 5\' 7"  (1.702 m)   Wt 175 lb (79.4 kg)   BMI 27.41 kg/m  Gen:  WD/WN, NAD Head: /AT, No temporalis wasting. Ear/Nose/Throat: Hearing grossly intact, nares w/o erythema  or drainage Eyes: Conjunctiva clear. Sclera non-icteric Neck: Supple.  Trachea midline Pulmonary:  Good air movement, no use of accessory muscles.  Cardiac: RRR, no JVD Vascular:  Vessel Right Left  Radial Palpable Palpable                          PT Palpable Palpable  DP Palpable Palpable   Gastrointestinal: soft, non-tender/non-distended. No guarding/reflex.  Musculoskeletal: M/S 5/5 throughout.  No deformity or atrophy.  1+ left lower extremity edema. Neurologic: Sensation grossly intact in extremities.  Symmetrical.  Speech is fluent.  Psychiatric: Judgment intact, Mood & affect appropriate for pt's clinical situation. Dermatologic: No rashes or ulcers noted.  No cellulitis or open wounds.       Labs Recent Results (from the past 2160 hour(s))  SARS CORONAVIRUS 2 (TAT 6-24 HRS) Nasopharyngeal Nasopharyngeal Swab     Status: None   Collection Time: 07/19/19  1:25 PM   Specimen: Nasopharyngeal Swab  Result Value Ref Range   SARS Coronavirus 2 NEGATIVE NEGATIVE    Comment: (NOTE) SARS-CoV-2 target nucleic acids are NOT DETECTED. The SARS-CoV-2 RNA is generally detectable in upper and lower respiratory specimens during the acute phase of infection. Negative results do not preclude SARS-CoV-2 infection, do not rule out co-infections with other pathogens, and should not be used as the sole basis for treatment or other patient management decisions. Negative results must be combined with clinical observations, patient history, and epidemiological information. The expected result is Negative. Fact Sheet for Patients: HairSlick.no Fact Sheet for Healthcare Providers: quierodirigir.com This test is not yet approved or cleared by the [place] FDA and  has been authorized for detection and/or diagnosis of SARS-CoV-2 by FDA under an Emergency Use Authorization (EUA). This EUA will remain  in effect (meaning this test  can be used) for the duration of the COVID-19 declaration under Section 56 4(b)(1) of the Act, 21 U.S.C. section 360bbb-3(b)(1), unless the authorization is terminated or revoked sooner. Performed at [facility], [address] [place], [place] [address]   BUN     Status: None   Collection Time: 07/23/19  8:10 AM  Result Value Ref Range   BUN 22 8 - 23 mg/dL    Comment: Performed at [facility], [address] [place], [place] [address]  Creatinine, serum     Status: None   Collection Time: 07/23/19  8:10 AM  Result Value Ref Range   Creatinine, Ser 0.77 0.61 - 1.24 mg/dL   GFR calc non Af Amer >60 >60 mL/min   GFR calc Af Amer >60 >60 mL/min    Comment: Performed at [facility], [address] [place], [place] [address]  Glucose, capillary     Status: Abnormal   Collection Time: 07/23/19  8:15 AM  Result Value Ref Range   Glucose-Capillary 281 (H) 70 - 99 mg/dL    Comment: Glucose reference range applies only to samples taken after fasting for at least 8 hours.  SARS CORONAVIRUS 2 (TAT 6-24 HRS) Nasopharyngeal Nasopharyngeal Swab     Status: None   Collection Time: 07/26/19  8:55 AM   Specimen: Nasopharyngeal Swab  Result Value Ref Range   SARS Coronavirus 2 NEGATIVE NEGATIVE    Comment: (NOTE) SARS-CoV-2 target nucleic acids are NOT DETECTED. The SARS-CoV-2 RNA is generally detectable in upper and lower respiratory specimens during the acute phase of infection. Negative results do not preclude SARS-CoV-2 infection, do not rule out co-infections with other pathogens, and should not be used as the sole basis for treatment or other patient management decisions. Negative results must be combined with clinical observations, patient history, and epidemiological information. The expected result is Negative. Fact Sheet for Patients: HairSlick.no Fact Sheet for Healthcare  Providers: quierodirigir.com This test is not yet approved or cleared by the [place] FDA and  has been authorized for detection and/or diagnosis of SARS-CoV-2 by FDA under an Emergency Use Authorization (EUA). This EUA will  remain  in effect (meaning this test can be used) for the duration of the COVID-19 declaration under Section 56 4(b)(1) of the Act, 21 U.S.C. section 360bbb-3(b)(1), unless the authorization is terminated or revoked sooner. Performed at [facility], [place] [address] [place], [place] [address]   Glucose, capillary     Status: Abnormal   Collection Time: 07/30/19  8:05 AM  Result Value Ref Range   Glucose-Capillary 241 (H) 70 - 99 mg/dL    Comment: Glucose reference range applies only to samples taken after fasting for at least 8 hours.  BUN     Status: None   Collection Time: 07/30/19  8:15 AM  Result Value Ref Range   BUN 17 8 - 23 mg/dL    Comment: Performed at [facility], [address] [place], [place] [address]  Creatinine, serum     Status: None   Collection Time: 07/30/19  8:15 AM  Result Value Ref Range   Creatinine, Ser 0.91 0.61 - 1.24 mg/dL   GFR calc non Af Amer >60 >60 mL/min   GFR calc Af Amer >60 >60 mL/min    Comment: Performed at [facility], [address] [place], [place] [address]    Radiology PERIPHERAL VASCULAR CATHETERIZATION  Result Date: 07/30/2019 See op note  VAS [place] ABI WITH/WO TBI  Result Date: 08/17/2019 LOWER EXTREMITY DOPPLER STUDY Indications: Peripheral artery disease.  Vascular Interventions: 12/11/18: Right SFA/popliteal stent with right CIA, TP                         trunk & posterior tibial PTAs;                         12/18/18: Left CIA & SFA stents with left popliteal, TP                         trunk & peroneal PTAs;                         07/23/19: Left SFA/popliteal stent with TP trunk &                         posterior tibial PTAs;                          07/30/19: Right SFA/popliteal thrombectomies/PTAs with                         right popliteal stent;. Performing Technologist: Blondell Reveal RT, RDMS, RVT  Examination Guidelines: A complete evaluation includes at minimum, Doppler waveform signals and systolic blood pressure reading at the level of bilateral brachial, anterior tibial, and posterior tibial arteries, when vessel segments are accessible. Bilateral testing is considered an integral part of a complete examination. Photoelectric Plethysmograph (PPG) waveforms and toe systolic pressure readings are included as required and additional duplex testing as needed. Limited examinations for reoccurring indications may be performed as noted.  ABI Findings: +---------+------------------+-----+--------+---------------+ Right    Rt Pressure (mmHg)IndexWaveformComment         +---------+------------------+-----+--------+---------------+ Brachial 125                                            +---------+------------------+-----+--------+---------------+  ATA                             absent                  +---------+------------------+-----+--------+---------------+ PTA      131               1.05 biphasic                +---------+------------------+-----+--------+---------------+ PERO     128               1.02 biphasicretrograde flow +---------+------------------+-----+--------+---------------+ Great Toe86                0.69 Normal                  +---------+------------------+-----+--------+---------------+ +---------+------------------+-----+--------+-------+ Left     Lt Pressure (mmHg)IndexWaveformComment +---------+------------------+-----+--------+-------+ Brachial 118                                    +---------+------------------+-----+--------+-------+ ATA                             absent          +---------+------------------+-----+--------+-------+ PTA      132               1.06 biphasic         +---------+------------------+-----+--------+-------+ PERO     120               0.96 biphasic        +---------+------------------+-----+--------+-------+ Great Toe67                0.54 Normal          +---------+------------------+-----+--------+-------+ +-------+-----------+-----------+------------+------------+ ABI/TBIToday's ABIToday's TBIPrevious ABIPrevious TBI +-------+-----------+-----------+------------+------------+ Right  1.05       0.69       0.73        0.45         +-------+-----------+-----------+------------+------------+ Left   1.06       0.54       0.68        0.46         +-------+-----------+-----------+------------+------------+  Limited duplex of bilateral ankle and left lower calf areas: No flow detected in the bilateral anterior tibial arteries. Normal compressibility of the left posterior tibial and peroneals veins of mid/distal calf and ankle area without atypical incidental  findings.  Bilateral ABIs appear increased compared to prior study on 07/10/19.  Summary: Right: Resting right ankle-brachial index is within normal range. No evidence of significant right lower extremity arterial disease. The right toe-brachial index is normal. Left: Resting left ankle-brachial index is within normal range. No evidence of significant left lower extremity arterial disease. The left toe-brachial index is abnormal.  *See table(s) above for measurements and observations.  Electronically signed by Festus Barren MD on 08/17/2019 at 8:30:49 AM.    Final     Assessment/Plan Benign essential hypertension blood pressure control important in reducing the progression of atherosclerotic disease. On appropriate oral medications.   Mixed hyperlipidemia lipid control important in reducing the progression of atherosclerotic disease. Continue statin therapy   CAD (coronary artery disease), autologous vein bypass graft Apparentlymayneed a second open heart surgery. Had  an original open heart surgery with right saphenous vein harvest over a decade  ago  Diabetes mellitus type 2, uncomplicated (HCC) blood glucose control important in reducing the progression of atherosclerotic disease. Also, involved in wound healing. On appropriate medications.  Atherosclerosis of native arteries of extremity with intermittent claudication (HCC) Status post intervention bilaterally.  No evidence of pseudoaneurysm on exam today with duplex.  His perfusion was recently checked and was back to normal.  It appears his current pain is due to a ruptured Baker's cyst in the left calf.  We will recheck his perfusion in about 3 months.  Continue current medical regimen.  Pain in limb Duplex today shows no evidence of pseudoaneurysm after intervention on the left leg.  His prominent finding on duplex today is of what appears to be a ruptured Baker's cyst behind the left knee and tracking down into the mid calf on duplex.  We discussed the use of leg elevation and compression.  We discussed the use of anti-inflammatories and the fact that this would likely take several weeks to resolve.  He voices his understanding.  He will be returning for follow-up in 3 months for his PAD but can contact us sooner with any problems in the interim.    Festus Barren, MD  08/28/2019 10:03 AM    This note was created with Dragon medical transcription system.  Any errors from dictation are purely unintentional

## 2019-08-29 ENCOUNTER — Encounter (INDEPENDENT_AMBULATORY_CARE_PROVIDER_SITE_OTHER): Payer: Commercial Managed Care - PPO

## 2019-08-29 ENCOUNTER — Ambulatory Visit (INDEPENDENT_AMBULATORY_CARE_PROVIDER_SITE_OTHER): Payer: Commercial Managed Care - PPO | Admitting: Nurse Practitioner

## 2019-09-07 ENCOUNTER — Other Ambulatory Visit (INDEPENDENT_AMBULATORY_CARE_PROVIDER_SITE_OTHER): Payer: Self-pay | Admitting: Nurse Practitioner

## 2019-09-11 ENCOUNTER — Telehealth: Payer: Self-pay | Admitting: *Deleted

## 2019-09-11 DIAGNOSIS — Z122 Encounter for screening for malignant neoplasm of respiratory organs: Secondary | ICD-10-CM

## 2019-09-11 DIAGNOSIS — Z87891 Personal history of nicotine dependence: Secondary | ICD-10-CM

## 2019-09-11 NOTE — Telephone Encounter (Signed)
Received referral for low dose lung cancer screening CT scan. Message left at phone number listed in EMR for patient to call me back to facilitate scheduling scan.  

## 2019-09-11 NOTE — Addendum Note (Signed)
Addended by: Jonne Ply on: 09/11/2019 02:14 PM   Modules accepted: Orders

## 2019-09-11 NOTE — Telephone Encounter (Signed)
Received referral for initial lung cancer screening scan. Contacted patient and obtained smoking history,(current, 66 pack year) as well as answering questions related to screening process. Patient denies signs of lung cancer such as weight loss or hemoptysis. Patient denies comorbidity that would prevent curative treatment if lung cancer were found. Patient is scheduled for shared decision making visit and CT scan on date tbd r/t preference. Marland Kitchen

## 2019-09-12 ENCOUNTER — Inpatient Hospital Stay: Payer: Commercial Managed Care - PPO | Attending: Oncology | Admitting: Oncology

## 2019-09-12 ENCOUNTER — Other Ambulatory Visit: Payer: Self-pay

## 2019-09-12 DIAGNOSIS — Z87891 Personal history of nicotine dependence: Secondary | ICD-10-CM | POA: Diagnosis not present

## 2019-09-12 NOTE — Progress Notes (Signed)
Virtual Visit via Video Note  I connected with Francisco Medina on 09/12/19 at  3:30 PM EDT by a video enabled telemedicine application and verified that I am speaking with the correct person using two identifiers.  Location: Patient: Home Provider: Clinic    I discussed the limitations of evaluation and management by telemedicine and the availability of in person appointments. The patient expressed understanding and agreed to proceed.  I discussed the assessment and treatment plan with the patient. The patient was provided an opportunity to ask questions and all were answered. The patient agreed with the plan and demonstrated an understanding of the instructions.   The patient was advised to call back or seek an in-person evaluation if the symptoms worsen or if the condition fails to improve as anticipated.   In accordance with CMS guidelines, patient has met eligibility criteria including age, absence of signs or symptoms of lung cancer.  Social History   Tobacco Use  . Smoking status: Former Smoker    Packs/day: 1.00    Years: 10.00    Pack years: 10.00  . Smokeless tobacco: Never Used  Vaping Use  . Vaping Use: Never used  Substance Use Topics  . Alcohol use: Not Currently  . Drug use: Not Currently      A shared decision-making session was conducted prior to the performance of CT scan. This includes one or more decision aids, includes benefits and harms of screening, follow-up diagnostic testing, over-diagnosis, false positive rate, and total radiation exposure.   Counseling on the importance of adherence to annual lung cancer LDCT screening, impact of co-morbidities, and ability or willingness to undergo diagnosis and treatment is imperative for compliance of the program.   Counseling on the importance of continued smoking cessation for former smokers; the importance of smoking cessation for current smokers, and information about tobacco cessation interventions have been given  to patient including [place] and [employer] quit [place] programs.   Written order for lung cancer screening with LDCT has been given to the patient and any and all questions have been answered to the best of my abilities.    Yearly follow up will be coordinated by Burgess Estelle, Thoracic Navigator.  I provided 15  minutes of face-to-face video visit time during this encounter, and > 50% was spent counseling as documented under my assessment & plan.   Jacquelin Hawking, NP

## 2019-09-13 ENCOUNTER — Ambulatory Visit
Admission: RE | Admit: 2019-09-13 | Discharge: 2019-09-13 | Disposition: A | Discharge status: Home / Self Care | Payer: Commercial Managed Care - PPO | Source: Ambulatory Visit | Attending: Oncology | Admitting: Oncology

## 2019-09-13 ENCOUNTER — Other Ambulatory Visit: Payer: Self-pay

## 2019-09-13 DIAGNOSIS — Z87891 Personal history of nicotine dependence: Secondary | ICD-10-CM

## 2019-09-13 DIAGNOSIS — Z122 Encounter for screening for malignant neoplasm of respiratory organs: Secondary | ICD-10-CM

## 2019-09-13 DIAGNOSIS — I700 Atherosclerosis of aorta: Secondary | ICD-10-CM | POA: Insufficient documentation

## 2019-09-14 ENCOUNTER — Telehealth: Payer: Self-pay | Admitting: *Deleted

## 2019-09-14 NOTE — Telephone Encounter (Signed)

## 2019-10-17 ENCOUNTER — Encounter (INDEPENDENT_AMBULATORY_CARE_PROVIDER_SITE_OTHER): Payer: Commercial Managed Care - PPO

## 2019-10-17 ENCOUNTER — Ambulatory Visit (INDEPENDENT_AMBULATORY_CARE_PROVIDER_SITE_OTHER): Payer: Commercial Managed Care - PPO | Admitting: Nurse Practitioner

## 2019-11-27 ENCOUNTER — Other Ambulatory Visit: Payer: Self-pay

## 2019-11-27 ENCOUNTER — Ambulatory Visit (INDEPENDENT_AMBULATORY_CARE_PROVIDER_SITE_OTHER): Payer: Commercial Managed Care - PPO | Admitting: Vascular Surgery

## 2019-11-27 ENCOUNTER — Ambulatory Visit (INDEPENDENT_AMBULATORY_CARE_PROVIDER_SITE_OTHER): Payer: Commercial Managed Care - PPO

## 2019-11-27 DIAGNOSIS — I70213 Atherosclerosis of native arteries of extremities with intermittent claudication, bilateral legs: Secondary | ICD-10-CM

## 2019-11-29 ENCOUNTER — Encounter (INDEPENDENT_AMBULATORY_CARE_PROVIDER_SITE_OTHER): Payer: Self-pay | Admitting: Vascular Surgery

## 2020-01-13 ENCOUNTER — Emergency Department: Payer: Commercial Managed Care - PPO

## 2020-01-13 ENCOUNTER — Inpatient Hospital Stay
Admission: EM | Admit: 2020-01-13 | Discharge: 2020-01-15 | DRG: 271 | Disposition: A | Discharge status: Home / Self Care | Payer: Commercial Managed Care - PPO | Attending: Vascular Surgery | Admitting: Vascular Surgery

## 2020-01-13 ENCOUNTER — Inpatient Hospital Stay: Payer: Commercial Managed Care - PPO

## 2020-01-13 ENCOUNTER — Encounter
Admission: EM | Disposition: A | Discharge status: Home / Self Care | Payer: Self-pay | Source: Home / Self Care | Attending: Vascular Surgery

## 2020-01-13 ENCOUNTER — Other Ambulatory Visit: Payer: Self-pay

## 2020-01-13 DIAGNOSIS — Z20822 Contact with and (suspected) exposure to covid-19: Secondary | ICD-10-CM | POA: Diagnosis present

## 2020-01-13 DIAGNOSIS — Z7902 Long term (current) use of antithrombotics/antiplatelets: Secondary | ICD-10-CM

## 2020-01-13 DIAGNOSIS — I25810 Atherosclerosis of coronary artery bypass graft(s) without angina pectoris: Secondary | ICD-10-CM | POA: Diagnosis present

## 2020-01-13 DIAGNOSIS — E1165 Type 2 diabetes mellitus with hyperglycemia: Secondary | ICD-10-CM | POA: Diagnosis present

## 2020-01-13 DIAGNOSIS — Z7982 Long term (current) use of aspirin: Secondary | ICD-10-CM

## 2020-01-13 DIAGNOSIS — R739 Hyperglycemia, unspecified: Secondary | ICD-10-CM | POA: Diagnosis not present

## 2020-01-13 DIAGNOSIS — I110 Hypertensive heart disease with heart failure: Secondary | ICD-10-CM | POA: Diagnosis present

## 2020-01-13 DIAGNOSIS — I7090 Unspecified atherosclerosis: Secondary | ICD-10-CM | POA: Diagnosis not present

## 2020-01-13 DIAGNOSIS — D649 Anemia, unspecified: Secondary | ICD-10-CM | POA: Diagnosis present

## 2020-01-13 DIAGNOSIS — Z87891 Personal history of nicotine dependence: Secondary | ICD-10-CM | POA: Diagnosis not present

## 2020-01-13 DIAGNOSIS — R059 Cough, unspecified: Secondary | ICD-10-CM | POA: Diagnosis not present

## 2020-01-13 DIAGNOSIS — Z79899 Other long term (current) drug therapy: Secondary | ICD-10-CM

## 2020-01-13 DIAGNOSIS — I743 Embolism and thrombosis of arteries of the lower extremities: Secondary | ICD-10-CM | POA: Diagnosis present

## 2020-01-13 DIAGNOSIS — I70222 Atherosclerosis of native arteries of extremities with rest pain, left leg: Secondary | ICD-10-CM | POA: Diagnosis present

## 2020-01-13 DIAGNOSIS — Z888 Allergy status to other drugs, medicaments and biological substances status: Secondary | ICD-10-CM

## 2020-01-13 DIAGNOSIS — E1151 Type 2 diabetes mellitus with diabetic peripheral angiopathy without gangrene: Secondary | ICD-10-CM | POA: Diagnosis present

## 2020-01-13 DIAGNOSIS — I7092 Chronic total occlusion of artery of the extremities: Secondary | ICD-10-CM | POA: Diagnosis present

## 2020-01-13 DIAGNOSIS — Z7984 Long term (current) use of oral hypoglycemic drugs: Secondary | ICD-10-CM | POA: Diagnosis not present

## 2020-01-13 DIAGNOSIS — R9431 Abnormal electrocardiogram [ECG] [EKG]: Secondary | ICD-10-CM | POA: Diagnosis not present

## 2020-01-13 DIAGNOSIS — I4510 Unspecified right bundle-branch block: Secondary | ICD-10-CM | POA: Diagnosis present

## 2020-01-13 DIAGNOSIS — I998 Other disorder of circulatory system: Secondary | ICD-10-CM | POA: Diagnosis not present

## 2020-01-13 DIAGNOSIS — I342 Nonrheumatic mitral (valve) stenosis: Secondary | ICD-10-CM | POA: Diagnosis not present

## 2020-01-13 DIAGNOSIS — R262 Difficulty in walking, not elsewhere classified: Secondary | ICD-10-CM | POA: Diagnosis present

## 2020-01-13 DIAGNOSIS — I5042 Chronic combined systolic (congestive) and diastolic (congestive) heart failure: Secondary | ICD-10-CM | POA: Diagnosis present

## 2020-01-13 HISTORY — DX: Type 2 diabetes mellitus without complications: E11.9

## 2020-01-13 HISTORY — PX: LOWER EXTREMITY ANGIOGRAPHY: CATH118251

## 2020-01-13 LAB — CBC WITH DIFFERENTIAL/PLATELET
Abs Immature Granulocytes: 0.04 10*3/uL (ref 0.00–0.07)
Basophils Absolute: 0 10*3/uL (ref 0.0–0.1)
Basophils Relative: 0 %
Eosinophils Absolute: 0.3 10*3/uL (ref 0.0–0.5)
Eosinophils Relative: 3 %
HCT: 40.2 % (ref 39.0–52.0)
Hemoglobin: 13.4 g/dL (ref 13.0–17.0)
Immature Granulocytes: 0 %
Lymphocytes Relative: 15 %
Lymphs Abs: 1.6 10*3/uL (ref 0.7–4.0)
MCH: 29 pg (ref 26.0–34.0)
MCHC: 33.3 g/dL (ref 30.0–36.0)
MCV: 87 fL (ref 80.0–100.0)
Monocytes Absolute: 0.7 10*3/uL (ref 0.1–1.0)
Monocytes Relative: 7 %
Neutro Abs: 8.1 10*3/uL — ABNORMAL HIGH (ref 1.7–7.7)
Neutrophils Relative %: 75 %
Platelets: 213 10*3/uL (ref 150–400)
RBC: 4.62 MIL/uL (ref 4.22–5.81)
RDW: 13.4 % (ref 11.5–15.5)
WBC: 10.8 10*3/uL — ABNORMAL HIGH (ref 4.0–10.5)
nRBC: 0 % (ref 0.0–0.2)

## 2020-01-13 LAB — CBC
HCT: 37.4 % — ABNORMAL LOW (ref 39.0–52.0)
HCT: 36 % — ABNORMAL LOW (ref 39.0–52.0)
HCT: 35.2 % — ABNORMAL LOW (ref 39.0–52.0)
Hemoglobin: 12.2 g/dL — ABNORMAL LOW (ref 13.0–17.0)
Hemoglobin: 11.8 g/dL — ABNORMAL LOW (ref 13.0–17.0)
Hemoglobin: 11.5 g/dL — ABNORMAL LOW (ref 13.0–17.0)
MCH: 28.8 pg (ref 26.0–34.0)
MCH: 28.9 pg (ref 26.0–34.0)
MCH: 29 pg (ref 26.0–34.0)
MCHC: 32.6 g/dL (ref 30.0–36.0)
MCHC: 32.8 g/dL (ref 30.0–36.0)
MCHC: 32.7 g/dL (ref 30.0–36.0)
MCV: 88.2 fL (ref 80.0–100.0)
MCV: 88 fL (ref 80.0–100.0)
MCV: 88.9 fL (ref 80.0–100.0)
Platelets: 187 10*3/uL (ref 150–400)
Platelets: 170 10*3/uL (ref 150–400)
Platelets: 150 10*3/uL (ref 150–400)
RBC: 4.24 MIL/uL (ref 4.22–5.81)
RBC: 4.09 MIL/uL — ABNORMAL LOW (ref 4.22–5.81)
RBC: 3.96 MIL/uL — ABNORMAL LOW (ref 4.22–5.81)
RDW: 13.5 % (ref 11.5–15.5)
RDW: 13.7 % (ref 11.5–15.5)
RDW: 13.5 % (ref 11.5–15.5)
WBC: 8.9 10*3/uL (ref 4.0–10.5)
WBC: 8.6 10*3/uL (ref 4.0–10.5)
WBC: 7.4 10*3/uL (ref 4.0–10.5)
nRBC: 0 % (ref 0.0–0.2)
nRBC: 0 % (ref 0.0–0.2)
nRBC: 0 % (ref 0.0–0.2)

## 2020-01-13 LAB — COMPREHENSIVE METABOLIC PANEL
ALT: 18 U/L (ref 0–44)
AST: 18 U/L (ref 15–41)
Albumin: 4.4 g/dL (ref 3.5–5.0)
Alkaline Phosphatase: 70 U/L (ref 38–126)
Anion gap: 12 (ref 5–15)
BUN: 17 mg/dL (ref 8–23)
CO2: 23 mmol/L (ref 22–32)
Calcium: 9.4 mg/dL (ref 8.9–10.3)
Chloride: 103 mmol/L (ref 98–111)
Creatinine, Ser: 1.13 mg/dL (ref 0.61–1.24)
GFR, Estimated: >60 mL/min (ref >60)
Glucose, Bld: 204 mg/dL — ABNORMAL HIGH (ref 70–99)
Potassium: 4 mmol/L (ref 3.5–5.1)
Sodium: 138 mmol/L (ref 135–145)
Total Bilirubin: 1.1 mg/dL (ref 0.3–1.2)
Total Protein: 7.6 g/dL (ref 6.5–8.1)

## 2020-01-13 LAB — APTT
aPTT: 30 seconds (ref 24–36)
aPTT: 43 seconds — ABNORMAL HIGH (ref 24–36)
aPTT: 46 seconds — ABNORMAL HIGH (ref 24–36)

## 2020-01-13 LAB — HEPARIN LEVEL (UNFRACTIONATED)
Heparin Unfractionated: 0.14 IU/mL — ABNORMAL LOW (ref 0.30–0.70)
Heparin Unfractionated: <0.1 IU/mL — ABNORMAL LOW (ref 0.30–0.70)

## 2020-01-13 LAB — RESPIRATORY PANEL BY RT PCR (FLU A&B, COVID)
Influenza A by PCR: NEGATIVE
Influenza B by PCR: NEGATIVE
SARS Coronavirus 2 by RT PCR: NEGATIVE

## 2020-01-13 LAB — GLUCOSE, CAPILLARY
Glucose-Capillary: 109 mg/dL — ABNORMAL HIGH (ref 70–99)
Glucose-Capillary: 93 mg/dL (ref 70–99)
Glucose-Capillary: 91 mg/dL (ref 70–99)
Glucose-Capillary: 115 mg/dL — ABNORMAL HIGH (ref 70–99)
Glucose-Capillary: 152 mg/dL — ABNORMAL HIGH (ref 70–99)

## 2020-01-13 LAB — FIBRINOGEN
Fibrinogen: 417 mg/dL (ref 210–475)
Fibrinogen: 406 mg/dL (ref 210–475)
Fibrinogen: 399 mg/dL (ref 210–475)

## 2020-01-13 LAB — CK: Total CK: 42 U/L — ABNORMAL LOW (ref 49–397)

## 2020-01-13 LAB — MRSA PCR SCREENING: MRSA by PCR: NEGATIVE

## 2020-01-13 LAB — HEMOGLOBIN A1C
Hgb A1c MFr Bld: 7.6 % — ABNORMAL HIGH (ref 4.8–5.6)
Mean Plasma Glucose: 171.42 mg/dL

## 2020-01-13 LAB — PROTIME-INR
INR: 1 (ref 0.8–1.2)
Prothrombin Time: 13.2 seconds (ref 11.4–15.2)

## 2020-01-13 LAB — LACTIC ACID, PLASMA: Lactic Acid, Venous: 1.5 mmol/L (ref 0.5–1.9)

## 2020-01-13 LAB — TROPONIN I (HIGH SENSITIVITY)
Troponin I (High Sensitivity): 9 ng/L (ref <18)
Troponin I (High Sensitivity): 10 ng/L (ref <18)
Troponin I (High Sensitivity): 10 ng/L (ref <18)

## 2020-01-13 LAB — SAMPLE TO BLOOD BANK

## 2020-01-13 LAB — CBG MONITORING, ED: Glucose-Capillary: 157 mg/dL — ABNORMAL HIGH (ref 70–99)

## 2020-01-13 SURGERY — LOWER EXTREMITY ANGIOGRAPHY
Anesthesia: Moderate Sedation | Laterality: Left

## 2020-01-13 MED ORDER — HEPARIN BOLUS VIA INFUSION
5000.0000 [IU] | Freq: Once | INTRAVENOUS | Status: AC
  Administered 2020-01-13: 5000 [IU] via INTRAVENOUS
  Filled 2020-01-13: qty 5000

## 2020-01-13 MED ORDER — HYDROMORPHONE HCL 1 MG/ML IJ SOLN
0.5000 mg | INTRAMUSCULAR | Status: DC | PRN
  Administered 2020-01-13: 0.5 mg via INTRAVENOUS
  Filled 2020-01-13: qty 1

## 2020-01-13 MED ORDER — ISOSORBIDE MONONITRATE ER 30 MG PO TB24
30.0000 mg | ORAL_TABLET | Freq: Every day | ORAL | Status: DC
  Administered 2020-01-14 – 2020-01-15 (×2): 30 mg via ORAL
  Filled 2020-01-13 (×2): qty 1

## 2020-01-13 MED ORDER — SODIUM CHLORIDE 0.9% FLUSH: 3.0000 mL | INTRAVENOUS | Status: DC | PRN

## 2020-01-13 MED ORDER — COLCHICINE 0.6 MG PO TABS
0.6000 mg | ORAL_TABLET | Freq: Every day | ORAL | Status: DC | PRN
  Filled 2020-01-13: qty 1

## 2020-01-13 MED ORDER — LATANOPROST 0.005 % OP SOLN
1.0000 [drp] | Freq: Every day | OPHTHALMIC | Status: DC
  Administered 2020-01-14: 1 [drp] via OPHTHALMIC
  Filled 2020-01-13 (×2): qty 2.5

## 2020-01-13 MED ORDER — MIDAZOLAM HCL 2 MG/2ML IJ SOLN
INTRAMUSCULAR | Status: DC | PRN
  Administered 2020-01-13 (×3): 1 mg via INTRAVENOUS

## 2020-01-13 MED ORDER — ONDANSETRON HCL 4 MG/2ML IJ SOLN: 4.0000 mg | Freq: Four times a day (QID) | INTRAMUSCULAR | Status: DC | PRN

## 2020-01-13 MED ORDER — CEFAZOLIN SODIUM-DEXTROSE 2-4 GM/100ML-% IV SOLN
2.0000 g | Freq: Once | INTRAVENOUS | Status: AC
  Administered 2020-01-13 – 2020-01-14 (×2): 2 g via INTRAVENOUS

## 2020-01-13 MED ORDER — HEPARIN SODIUM (PORCINE) 1000 UNIT/ML IJ SOLN
INTRAMUSCULAR | Status: DC | PRN
  Administered 2020-01-13: 2000 [IU] via INTRAVENOUS

## 2020-01-13 MED ORDER — NITROGLYCERIN 0.4 MG SL SUBL: 0.4000 mg | SUBLINGUAL_TABLET | SUBLINGUAL | Status: DC | PRN

## 2020-01-13 MED ORDER — LACTATED RINGERS IV BOLUS
500.0000 mL | Freq: Once | INTRAVENOUS | Status: AC
  Administered 2020-01-13: 500 mL via INTRAVENOUS

## 2020-01-13 MED ORDER — DEXTROSE IN LACTATED RINGERS 5 % IV SOLN: INTRAVENOUS | Status: DC

## 2020-01-13 MED ORDER — SODIUM CHLORIDE 0.9% FLUSH
3.0000 mL | Freq: Two times a day (BID) | INTRAVENOUS | Status: DC
  Administered 2020-01-13 – 2020-01-14 (×3): 3 mL via INTRAVENOUS

## 2020-01-13 MED ORDER — MIDAZOLAM HCL 2 MG/2ML IJ SOLN: 1.0000 mg | INTRAMUSCULAR | Status: DC | PRN

## 2020-01-13 MED ORDER — IOHEXOL 350 MG/ML SOLN
125.0000 mL | Freq: Once | INTRAVENOUS | Status: AC | PRN
  Administered 2020-01-13: 125 mL via INTRAVENOUS

## 2020-01-13 MED ORDER — FLUTICASONE PROPIONATE 50 MCG/ACT NA SUSP
2.0000 | Freq: Every day | NASAL | Status: DC | PRN
  Filled 2020-01-13: qty 16

## 2020-01-13 MED ORDER — HYDROMORPHONE HCL 1 MG/ML IJ SOLN
1.0000 mg | INTRAMUSCULAR | Status: DC | PRN
  Administered 2020-01-13 – 2020-01-14 (×7): 1 mg via INTRAVENOUS
  Filled 2020-01-13 (×7): qty 1

## 2020-01-13 MED ORDER — STERILE WATER FOR INJECTION IJ SOLN
INTRAMUSCULAR | Status: AC
  Filled 2020-01-13: qty 10

## 2020-01-13 MED ORDER — ALTEPLASE 2 MG IJ SOLR
4.0000 mg | Freq: Once | INTRAMUSCULAR | Status: AC
  Administered 2020-01-13: 4 mg

## 2020-01-13 MED ORDER — BUDESONIDE 0.25 MG/2ML IN SUSP
0.2500 mg | Freq: Two times a day (BID) | RESPIRATORY_TRACT | Status: DC
  Administered 2020-01-13 – 2020-01-15 (×3): 0.25 mg via RESPIRATORY_TRACT
  Filled 2020-01-13 (×4): qty 2

## 2020-01-13 MED ORDER — FENTANYL CITRATE (PF) 100 MCG/2ML IJ SOLN
INTRAMUSCULAR | Status: AC
  Filled 2020-01-13: qty 2

## 2020-01-13 MED ORDER — HEPARIN (PORCINE) 25000 UT/250ML-% IV SOLN
1300.0000 [IU]/h | INTRAVENOUS | Status: DC
  Administered 2020-01-13: 1300 [IU]/h via INTRAVENOUS
  Filled 2020-01-13: qty 250

## 2020-01-13 MED ORDER — IPRATROPIUM-ALBUTEROL 0.5-2.5 (3) MG/3ML IN SOLN
3.0000 mL | Freq: Four times a day (QID) | RESPIRATORY_TRACT | Status: DC
  Administered 2020-01-13 – 2020-01-14 (×4): 3 mL via RESPIRATORY_TRACT
  Filled 2020-01-13 (×5): qty 3

## 2020-01-13 MED ORDER — MIDAZOLAM HCL 5 MG/5ML IJ SOLN
INTRAMUSCULAR | Status: AC
  Filled 2020-01-13: qty 5

## 2020-01-13 MED ORDER — FENTANYL CITRATE (PF) 100 MCG/2ML IJ SOLN: 25.0000 ug | INTRAMUSCULAR | Status: DC | PRN

## 2020-01-13 MED ORDER — METOPROLOL TARTRATE 5 MG/5ML IV SOLN: 2.0000 mg | INTRAVENOUS | Status: DC | PRN

## 2020-01-13 MED ORDER — CHLORHEXIDINE GLUCONATE CLOTH 2 % EX PADS
6.0000 | MEDICATED_PAD | Freq: Every day | CUTANEOUS | Status: DC
  Administered 2020-01-14: 6 via TOPICAL

## 2020-01-13 MED ORDER — ALTEPLASE 2 MG IJ SOLR: 6.0000 mg | Freq: Once | INTRAMUSCULAR | Status: DC

## 2020-01-13 MED ORDER — INSULIN ASPART 100 UNIT/ML ~~LOC~~ SOLN
0.0000 [IU] | SUBCUTANEOUS | Status: DC
  Administered 2020-01-13 – 2020-01-14 (×3): 3 [IU] via SUBCUTANEOUS
  Filled 2020-01-13 (×3): qty 1

## 2020-01-13 MED ORDER — MORPHINE SULFATE (PF) 4 MG/ML IV SOLN: 5.0000 mg | INTRAVENOUS | Status: DC | PRN

## 2020-01-13 MED ORDER — HYDRALAZINE HCL 20 MG/ML IJ SOLN: 5.0000 mg | INTRAMUSCULAR | Status: DC | PRN

## 2020-01-13 MED ORDER — SODIUM CHLORIDE 0.9 % IV SOLN: 250.0000 mL | INTRAVENOUS | Status: DC | PRN

## 2020-01-13 MED ORDER — LABETALOL HCL 5 MG/ML IV SOLN: 10.0000 mg | INTRAVENOUS | Status: DC | PRN

## 2020-01-13 MED ORDER — CARVEDILOL 6.25 MG PO TABS
6.2500 mg | ORAL_TABLET | Freq: Two times a day (BID) | ORAL | Status: DC
  Administered 2020-01-13 – 2020-01-15 (×3): 6.25 mg via ORAL
  Filled 2020-01-13 (×3): qty 1

## 2020-01-13 MED ORDER — ACETAMINOPHEN 500 MG PO TABS
1000.0000 mg | ORAL_TABLET | Freq: Once | ORAL | Status: DC
  Filled 2020-01-13: qty 2

## 2020-01-13 MED ORDER — NITROGLYCERIN ER 2.5 MG PO CPCR: 2.5000 mg | ORAL_CAPSULE | Freq: Every day | ORAL | Status: DC

## 2020-01-13 MED ORDER — FENTANYL CITRATE (PF) 100 MCG/2ML IJ SOLN
INTRAMUSCULAR | Status: DC | PRN
  Administered 2020-01-13 (×3): 25 ug via INTRAVENOUS

## 2020-01-13 MED ORDER — HEPARIN SODIUM (PORCINE) 1000 UNIT/ML IJ SOLN
INTRAMUSCULAR | Status: AC
  Filled 2020-01-13: qty 1

## 2020-01-13 MED ORDER — FUROSEMIDE 20 MG PO TABS
40.0000 mg | ORAL_TABLET | Freq: Every day | ORAL | Status: DC
  Administered 2020-01-14 – 2020-01-15 (×2): 40 mg via ORAL
  Filled 2020-01-13: qty 2
  Filled 2020-01-13: qty 1

## 2020-01-13 MED ORDER — SODIUM CHLORIDE 0.9 % IV SOLN
0.5000 mg/h | INTRAVENOUS | Status: DC
  Administered 2020-01-13: 0.5 mg/h
  Filled 2020-01-13 (×2): qty 10

## 2020-01-13 MED ORDER — CEFAZOLIN SODIUM-DEXTROSE 2-4 GM/100ML-% IV SOLN
INTRAVENOUS | Status: AC
  Filled 2020-01-13: qty 100

## 2020-01-13 MED ORDER — HEPARIN (PORCINE) 25000 UT/250ML-% IV SOLN
1050.0000 [IU]/h | INTRAVENOUS | Status: DC
  Filled 2020-01-13: qty 250

## 2020-01-13 MED ORDER — FENTANYL CITRATE (PF) 100 MCG/2ML IJ SOLN
50.0000 ug | Freq: Once | INTRAMUSCULAR | Status: AC
  Administered 2020-01-13: 50 ug via INTRAVENOUS
  Filled 2020-01-13: qty 2

## 2020-01-13 MED ORDER — LOSARTAN POTASSIUM 50 MG PO TABS
100.0000 mg | ORAL_TABLET | Freq: Every day | ORAL | Status: DC
  Administered 2020-01-14 – 2020-01-15 (×2): 100 mg via ORAL
  Filled 2020-01-13 (×2): qty 2

## 2020-01-13 MED ORDER — FAMOTIDINE 20 MG PO TABS
20.0000 mg | ORAL_TABLET | Freq: Every day | ORAL | Status: DC
  Administered 2020-01-13 – 2020-01-14 (×2): 20 mg via ORAL
  Filled 2020-01-13 (×2): qty 1

## 2020-01-13 SURGICAL SUPPLY — 20 items
CANISTER PENUMBRA ENGINE (MISCELLANEOUS) ×3 IMPLANT
CANNULA 5F STIFF (CANNULA) ×3 IMPLANT
CATH ANGIO 5F PIGTAIL 65CM (CATHETERS) ×3 IMPLANT
CATH BEACON 5 .035 65 C2 TIP (CATHETERS) ×3 IMPLANT
CATH INDIGO CAT6 KIT (CATHETERS) ×3 IMPLANT
CATH INFUS 135CMX50CM (CATHETERS) ×3 IMPLANT
CATH VERT 5FR 125CM (CATHETERS) ×3 IMPLANT
DEVICE SAFEGUARD 24CM (GAUZE/BANDAGES/DRESSINGS) ×3 IMPLANT
DEVICE TORQUE .025-.038 (MISCELLANEOUS) ×3 IMPLANT
GLIDEWIRE ADV .035X260CM (WIRE) ×3 IMPLANT
GLIDEWIRE STIFF .35X180X3 HYDR (WIRE) ×3 IMPLANT
KIT CV MULTILUMEN 7FR 20 (SET/KITS/TRAYS/PACK) ×3
KIT CV MULTILUMEN 7FR 20 SUB (SET/KITS/TRAYS/PACK) ×1 IMPLANT
KIT LHK - MID 3VCOMP MANIF (MISCELLANEOUS) ×3 IMPLANT
PACK ANGIOGRAPHY (CUSTOM PROCEDURE TRAY) ×3 IMPLANT
SHEATH BRITE TIP 5FRX11 (SHEATH) ×3 IMPLANT
SHEATH DESTIN RDC 6FR 45 (SHEATH) ×3 IMPLANT
SUT SILK 0 FSL (SUTURE) ×3 IMPLANT
WIRE G V18X300CM (WIRE) ×3 IMPLANT
WIRE J 3MM .035X145CM (WIRE) ×3 IMPLANT

## 2020-01-13 NOTE — ED Notes (Signed)
Pt at CT

## 2020-01-13 NOTE — ED Notes (Signed)
Pt is currently hypotensive, pt denies dizziness, per ED MD this RN will finish  bolus and recheck BP Q15 minutes. Pt asks this RN to walk to restroom to urinate, pt educated due to blood clot this is not recommended at this time and requested pt use bedside urinal, pt states he "cant do this". Pt then asks to stand at bedside, pt hypotensive at this time, this RN gave options for external male cathter or to ask MD for indwelling, pt denies both and states "Ill hold it".

## 2020-01-13 NOTE — Op Note (Signed)
[facility]  Percutaneous Study/Intervention Procedural Note   Date of Surgery: 01/13/2020  Surgeon(s): Dr. Evie Lacks    Assistants:none  Pre-operative Diagnosis: Acute ischemia left lower extremity  Post-operative diagnosis:  Thrombosis and occlusion of left superficial femoral and popliteal arteries with acute ischemia of the left lower extremity  Procedure(s) Performed:             1.  Ultrasound guidance for vascular access right femoral artery             2.  Catheter placement into left common femoral artery             3.  Aortogram and selective left lower extremity angiogram            4.  Penumbra mechanical thrombectomy using a CAT 7 device             5.  Placement of thrombolysis catheter             6.  Placement of right femoral vein central line  EBL: 100  Contrast: 35 cc  Fluoro Time: 23.2 minutes  Moderate Conscious Sedation Time: approximately 1 hour 24 minutes 4 seconds using 3mg  of Versed and of Fentanyl              Indications:  Patient is a 64 y.o.male with history of bilateral lower extremity PAD with stenting.  Patient presented with acute ischemia of the left lower extremity.  The patient has noninvasive study showing complete occlusion of the left SFA and popliteal arteries including the SFA stent. The patient is brought in for angiography for further evaluation and potential treatment.  Due to the limb threatening nature of the situation, angiogram was performed for attempted limb salvage. The patient is aware that if the procedure fails, amputation would be expected.  The patient also understands that even with successful revascularization, amputation may still be required due to the severity of the situation.  Risks and benefits are discussed and informed consent is obtained.   Procedure:  The patient was identified and appropriate procedural time out was performed.  The patient was then placed supine on the table and prepped and  draped in the usual sterile fashion. Moderate conscious sedation was administered during a face to face encounter with the patient throughout the procedure with my supervision of the RN administering medicines and monitoring the patient's vital signs, pulse oximetry, telemetry and mental status throughout from the start of the procedure until the patient was taken to the recovery room. Ultrasound was used to evaluate the right common femoral artery.  It was patent .  A digital ultrasound image was acquired.  A Seldinger needle was used to access the right common femoral artery under direct ultrasound guidance and a permanent image was performed.  A 0.035 J wire was advanced without resistance and a 5Fr sheath was placed.  Pigtail catheter was placed into the aorta and an AP aortogram was performed. This demonstrated normal renal arteries and normal aorta and iliac segments without significant stenosis. I then crossed the aortic bifurcation and advanced to  left femoral head. Selective left lower extremity angiogram was then performed. This demonstrated abrupt occlusion of the left SFA and popliteal arteries. It was felt that it was in the patient's best interest to proceed with intervention after these images to avoid a second procedure and a larger amount of contrast and fluoroscopy based off of the findings from the initial angiogram. The patient was  systemically heparinized and a 6 [place] [employer] sheath was then placed over the [employer] wire. I then used a Kumpe catheter and the advantage wire to below-knee trifurcation.  Perform Mechanical Thrombectomy using a Cat#7 device.  There was significant thrombus with little result after several passes.  I then placed a thrombolysis catheter and wire at the distal SFA stent and proximal popliteal artery.  This was an connected to TPA and heparin.  Then under ultrasound guidance I placed a right femoral vein central line. I elected to terminate the procedure.  The  patient will be transferred to the ICU in guarded condition half.  Return to the [facility] suite tomorrow for additional intervention.  Findings:               Aortogram:  Moderately disease aorta with widely patent renal arteries the left iliac stent was patent with minimal disease.             Left lower Extremity:  Abrupt occlusion of the SFA at its takeoff from the common femoral artery profunda femoris was patent as well as the external iliac and common femoral artery.  There was some reconstitution at the below-knee popliteal artery and trifurcation with two-vessel runoff with the posterior tibial appearing to be the dominant vessel   Disposition: Patient was taken to the ICU in guarded condition.  Complications: None  Saud Bail A 01/13/2020 1:05 PM   This note was created with Dragon Medical transcription system. Any errors in dictation are purely unintentional.

## 2020-01-13 NOTE — Progress Notes (Signed)
ANTICOAGULATION CONSULT NOTE  Pharmacy Consult for heparin Indication: concern for extremity arterial clot  Allergies  Allergen Reactions  . Atorvastatin Other (See Comments)    Other reaction(s): Muscle Pain    Patient Measurements: Height: 5\' 8"  (172.7 cm) Weight: 80.7 kg (178 lb) IBW/kg (Calculated) : 68.4 Heparin Dosing Weight: 80 kg  Vital Signs: Temp: 99.1 F (37.3 C) (11/07 0845) Temp Source: Oral (11/07 0845) BP: 124/62 (11/07 0845) Pulse Rate: 83 (11/07 0845)  Labs: Recent Labs    01/13/20 0849  HGB 13.4  HCT 40.2  PLT 213    CrCl cannot be calculated (Patient's most recent lab result is older than the maximum 21 days allowed.).   Medical History: Past Medical History:  Diagnosis Date  . CHF (congestive heart failure) (HCC)   . Hypertension     Assessment: 64 year old male presented with foot pain, progressively worse. Patient with h/o stents placed to extremity. No anticoagulation noted PTA. Pharmacy to start heparin drip for concern for arterial clot in extremity.  Goal of Therapy:  Heparin level 0.3-0.7 units/ml Monitor platelets by anticoagulation protocol: Yes   Plan:  Heparin 5000 unit bolus followed by heparin drip at 1300 units/hr. HL at 1700. CBC daily while on heparin drip.  76, PharmD 01/13/2020,9:12 AM

## 2020-01-13 NOTE — Progress Notes (Addendum)
ANTICOAGULATION CONSULT NOTE  Pharmacy Consult for heparin Indication: VTE treatment  Allergies  Allergen Reactions  . Atorvastatin Other (See Comments)    Other reaction(s): Muscle Pain    Patient Measurements: Height: 5\' 8"  (172.7 cm) Weight: 80.7 kg (178 lb) IBW/kg (Calculated) : 68.4 Heparin Dosing Weight: 80 kg  Vital Signs: Temp: 99.1 F (37.3 C) (11/07 0845) Temp Source: Oral (11/07 0845) BP: 112/67 (11/07 1210) Pulse Rate: 72 (11/07 1100)  Labs: Recent Labs    01/13/20 0849  HGB 13.4  HCT 40.2  PLT 213  APTT 30  LABPROT 13.2  INR 1.0  CREATININE 1.13    Estimated Creatinine Clearance: 63.9 mL/min (by C-G formula based on SCr of 1.13 mg/dL).   Medical History: Past Medical History:  Diagnosis Date  . CHF (congestive heart failure) (HCC)   . Hypertension     Assessment: 64 year old male presented with foot pain, progressively worse. Patient known to service history of bilateral lower extremity stenting for PAD in May 2021. No anticoagulation noted PTA. CT obtained revealed occlusion of the LEFT SFA and popliteal arteries and stents. Pharmacy to start heparin dosing and monitoring. Baseline labs WNL. Patient to undergo thrombolysis today (11/7).  Goal of Therapy:  Heparin level 0.2-0.5 units/ml Monitor platelets by anticoagulation protocol: Yes   Plan:  Patient was initially started on heparin 5000 unit bolus followed by heparin drip at 1300 units/hr. Will restart heparin at lower rate of 800 units/hr per vascular. Goal heparin level 0.2-0.5 units/ml  Check HL in 6 hours after start of new rate and continue to monitor q6h x4 post procedure. CBC daily while on heparin drip.  02-26-1981, PharmD Pharmacy Resident  01/13/2020 12:18 PM

## 2020-01-13 NOTE — ED Notes (Signed)
Consent witnessed by this RN

## 2020-01-13 NOTE — ED Provider Notes (Addendum)
[facility] Emergency Department Provider Note  ____________________________________________   None    (approximate)  I have reviewed the triage vital signs and the nursing notes.   HISTORY  Chief Complaint Foot pain (L foot)   HPI Francisco Medina is a 64 y.o. male with past medical history of CHF, HTN, CAD, DM, HDL, tremor, and DVTs status post multiple stents in his bilateral lower extremities who presents for assessment of tingling and worsening pain in his left lower leg.  Patient states he initially noticed some tingling yesterday around 2 PM but feels it has gotten significantly worse and more painful over the course of the night into this morning.  States he talked to his vascular surgeon who told him come to the emergency room for evaluation.  He denies any recent falls or injuries.  He states the pain and tingling extends proximally from the foot all the way to slightly above the knee.  No clear alleviating or aggravating factors.  No prior similar episodes.  He denies any recent fevers, chills, cough, shortness of breath, chest pain, Donnell pain, back pain, vomiting, diarrhea, dysuria, blood in stool, blood in his urine, rash, or other acute complaints.         Past Medical History:  Diagnosis Date  . CHF (congestive heart failure) (HCC)   . Hypertension     Patient Active Problem List   Diagnosis Date Noted  . Pain in limb 08/28/2019  . Benign essential tremor 03/08/2019  . High risk medication use 03/08/2019  . Ischemic cardiomyopathy 03/08/2019  . Atherosclerosis of native arteries of extremity with intermittent claudication (HCC) 11/28/2018  . Peripheral arterial disease with history of revascularization (HCC) 11/28/2018  . Chest pain 05/30/2018  . Mixed hyperlipidemia 08/15/2015  . Hyperlipidemia associated with type 2 diabetes mellitus (HCC) 08/15/2015  . Blindness of left eye with normal vision in contralateral eye 05/09/2015  .  Gout 02/05/2015  . Diabetes mellitus type 2, uncomplicated (HCC) 01/17/2014  . RBBB 12/24/2013  . Benign essential hypertension 06/02/2013  . CAD (coronary artery disease), autologous vein bypass graft 06/02/2013  . CAD (coronary artery disease), native coronary artery 06/02/2013  . Type II or unspecified type diabetes mellitus without mention of complication, not stated as uncontrolled 06/02/2013  . Chronic systolic CHF (congestive heart failure) (HCC) 06/02/2013  . Hypertension associated with type 2 diabetes mellitus (HCC) 06/02/2013    Past Surgical History:  Procedure Laterality Date  . LEFT HEART CATH AND CORS/GRAFTS ANGIOGRAPHY N/A 04/17/2018   Procedure: LEFT HEART CATH AND CORS/GRAFTS ANGIOGRAPHY;  Surgeon: Dalia Heading, MD;  Location: [facility] INVASIVE CV LAB;  Service: Cardiovascular;  Laterality: N/A;  . LOWER EXTREMITY ANGIOGRAPHY Right 12/11/2018   Procedure: LOWER EXTREMITY ANGIOGRAPHY;  Surgeon: Annice Needy, MD;  Location: [facility] INVASIVE CV LAB;  Service: Cardiovascular;  Laterality: Right;  . LOWER EXTREMITY ANGIOGRAPHY Left 12/18/2018   Procedure: LOWER EXTREMITY ANGIOGRAPHY;  Surgeon: Annice Needy, MD;  Location: [facility] INVASIVE CV LAB;  Service: Cardiovascular;  Laterality: Left;  . LOWER EXTREMITY ANGIOGRAPHY Left 07/23/2019   Procedure: LOWER EXTREMITY ANGIOGRAPHY;  Surgeon: Annice Needy, MD;  Location: [facility] INVASIVE CV LAB;  Service: Cardiovascular;  Laterality: Left;  . LOWER EXTREMITY ANGIOGRAPHY Right 07/30/2019   Procedure: LOWER EXTREMITY ANGIOGRAPHY;  Surgeon: Annice Needy, MD;  Location: [facility] INVASIVE CV LAB;  Service: Cardiovascular;  Laterality: Right;    Prior to Admission medications   Medication Sig Start Date End Date Taking? [facility]  [facility]  acetaminophen (TYLENOL) 325 MG tablet Take 325 mg by mouth every 6 (six) hours as needed.    Yes [provider]  aspirin 325 MG tablet Take 325 mg by mouth at bedtime.   Yes [provider]    carvedilol (COREG) 6.25 MG tablet Take 6.25 mg by mouth 2 (two) times daily. 02/25/18  Yes [provider]  clopidogrel (PLAVIX) 75 MG tablet Take 75 mg by mouth daily. 10/04/18  Yes [provider]  colchicine 0.6 MG tablet Take 0.6 mg by mouth daily as needed (gout).  03/02/18  Yes [provider]  Docusate Sodium (DSS) 100 MG CAPS Take 100 mg by mouth at bedtime.    Yes [provider]  fluticasone (FLONASE) 50 MCG/ACT nasal spray Place 2 sprays into both nostrils daily as needed for allergies. 01/02/15  Yes [provider]  furosemide (LASIX) 40 MG tablet Take 40 mg by mouth daily. 11/03/18  Yes [provider]  glipiZIDE (GLUCOTROL XL) 5 MG 24 hr tablet Take 5 mg by mouth daily. 03/26/18  Yes [provider]  isosorbide mononitrate (IMDUR) 30 MG 24 hr tablet Take 30 mg by mouth daily. 11/09/18  Yes [provider]  JANUVIA 100 MG tablet Take 100 mg by mouth at bedtime.  12/19/19  Yes [provider]  latanoprost (XALATAN) 0.005 % ophthalmic solution Place 1 drop into both eyes at bedtime. 03/26/18  Yes [provider]  losartan (COZAAR) 100 MG tablet Take 100 mg by mouth daily. 10/06/18  Yes [provider]  metFORMIN (GLUCOPHAGE) 1000 MG tablet Take 1,000 mg by mouth 2 (two) times daily with a meal. 10/04/18  Yes [provider]  nitroGLYCERIN (NITROSTAT) 0.4 MG SL tablet Place 0.4 mg under the tongue every 5 (five) minutes as needed for chest pain.  04/11/18 01/13/20 Yes [provider]  nitroGLYCERIN 2.5 MG CR capsule Take 2.5 mg by mouth daily.  11/07/18  Yes [provider]  potassium chloride (K-DUR) 10 MEQ tablet Take 10 mEq by mouth daily. 11/03/18  Yes [provider]  rosuvastatin (CRESTOR) 20 MG tablet Take 20 mg by mouth at bedtime.  01/10/19 01/13/20 Yes [provider]  timolol (TIMOPTIC) 0.5 % ophthalmic solution Place 1 drop into both eyes every  morning. 12/05/14  Yes [provider]    Allergies Atorvastatin  Family History  Family history unknown: Yes    Social History Social History   Tobacco Use  . Smoking status: Former Smoker    Packs/day: 1.00    Years: 10.00    Pack years: 10.00  . Smokeless tobacco: Never Used  Vaping Use  . Vaping Use: Never used  Substance Use Topics  . Alcohol use: Not Currently  . Drug use: Not Currently    Review of Systems  Review of Systems  Constitutional: Negative for chills and fever.  HENT: Negative for sore throat.   Eyes: Negative for pain.  Respiratory: Negative for cough and stridor.   Cardiovascular: Negative for chest pain.  Gastrointestinal: Negative for vomiting.  Genitourinary: Negative for dysuria.  Musculoskeletal: Positive for myalgias ( L lower leg).  Skin: Negative for rash.  Neurological: Negative for seizures, loss of consciousness and headaches.  Psychiatric/Behavioral: Negative for suicidal ideas.  All other systems reviewed and are negative.     ____________________________________________   PHYSICAL EXAM:  VITAL SIGNS: ED Triage Vitals  Enc Vitals Group     BP      Pulse  Resp      Temp      Temp src      SpO2      Weight      Height      Head Circumference      Peak Flow      Pain Score      Pain Loc      Pain Edu?      Excl. in GC?    Vitals:   01/13/20 1000 01/13/20 1015  BP: (!) 93/56 120/83  Pulse: 78 79  Resp: 16 18  Temp:    SpO2: 95% 93%   Physical Exam Vitals and nursing note reviewed.  Constitutional:      Appearance: He is well-developed.  HENT:     Head: Normocephalic and atraumatic.     Right Ear: External ear normal.     Left Ear: External ear normal.     Nose: Nose normal.     Mouth/Throat:     Mouth: Mucous membranes are moist.  Eyes:     Conjunctiva/sclera: Conjunctivae normal.  Cardiovascular:     Rate and Rhythm: Normal rate and regular rhythm.     Heart sounds: No murmur heard.    Pulmonary:     Effort: Pulmonary effort is normal. No respiratory distress.     Breath sounds: Normal breath sounds.  Abdominal:     Palpations: Abdomen is soft.     Tenderness: There is no abdominal tenderness.  Musculoskeletal:     Cervical back: Neck supple.  Skin:    General: Skin is warm and dry.  Neurological:     Mental Status: He is alert and oriented to person, place, and time.  Psychiatric:        Mood and Affect: Mood normal.     2+ bilateral radial pulses.  2+ right DP pulse.  Left lower extremity significantly cooler from the right from the knee distally.  I am unable to Doppler a left DP pulse but I am able to palpate a Doppler left femoral and posterior tibialis pulse. ____________________________________________   LABS (all labs ordered are listed, but only abnormal results are displayed)  Labs Reviewed  CBC WITH DIFFERENTIAL/PLATELET - Abnormal; Notable for the following components:      Result Value   WBC 10.8 (*)    Neutro Abs 8.1 (*)    All other components within normal limits  COMPREHENSIVE METABOLIC PANEL - Abnormal; Notable for the following components:   Glucose, Bld 204 (*)    All other components within normal limits  CBG MONITORING, ED - Abnormal; Notable for the following components:   Glucose-Capillary 157 (*)    All other components within normal limits  RESPIRATORY PANEL BY RT PCR (FLU A&B, COVID)  PROTIME-INR  APTT  HEPARIN LEVEL (UNFRACTIONATED)  HEMOGLOBIN A1C   ____________________________________________  EKG  Sinus rhythm with a ventricular rate of 84, right bundle branch block and left posterior fascicle block with Q waves in the inferior leads and some wandering baseline in V1 and V2 without other clear evidence of acute ischemia.  These Q waves are noted on prior EKGs including in January 2015. ____________________________________________  RADIOLOGY   Official radiology report(s): CT ANGIO AO+BIFEM W & OR WO  CONTRAST  Result Date: 01/13/2020 CLINICAL DATA:  Peripheral vascular disease, left foot pain progressively getting worse. Concern for ischemia. EXAM: CT ANGIOGRAPHY OF ABDOMINAL AORTA WITH ILIOFEMORAL RUNOFF TECHNIQUE: Multidetector CT imaging of the abdomen, pelvis and lower extremities was performed using  the standard protocol during bolus administration of intravenous contrast. Multiplanar CT image reconstructions and MIPs were obtained to evaluate the vascular anatomy. CONTRAST:  OMNIPAQUE IOHEXOL 350 MG/ML SOLN COMPARISON:  None available FINDINGS: VASCULAR Aorta: Aortic atherosclerosis noted, most severe of the infrarenal aorta and bifurcation without occlusion, acute dissection, aneurysm, evidence of rupture or retroperitoneal hematoma. Celiac: Atherosclerotic origin but remains patent including its branches SMA: Atherosclerotic origin but remains patent including its branches Renals: Atherosclerotic origins with mild ostial narrowings but remain patent. No accessory renal artery. IMA: Remains patent off the distal aorta including its branches RIGHT Lower Extremity Inflow: Marked atherosclerotic change but the right common, internal, and external iliac arteries remain patent with areas of mild luminal narrowing. No significant right iliac inflow disease or occlusion. Outflow: Right common femoral artery is atherosclerotic but remains patent. Origin of the right profunda femoral artery is patent, however there is abrupt occlusion of the right profunda femoral artery in the deep thigh suggesting thrombo embolus. Right SFA is patent proximally with luminal irregularity and narrowing noted just proximal to the mid SFA stent origin, image 188 series 4. Right SFA lumen is narrowed greater than 50% but without occlusion. Right SFA stent has intra stent intimal thickening changes and areas of intra stent luminal narrowing but remains patent. Stented segments of the right popliteal artery extending below the  knee appear to remain patent. Runoff: Below the knee, the tibial peroneal trunk remains patent. Proximal anterior tibial artery is occluded heavily calcified. Peroneal artery tapers to occlusion proximally. Dominant runoff to the right lower extremity is the posterior tibial artery to the ankle. LEFT Lower Extremity Inflow: Left common iliac stent is patent. Extensive left iliac system atherosclerotic change but without acute occlusion or significant inflow abnormality. No thrombus evident. Outflow: Heavily calcified but patent left common femoral and profunda femoral arteries. There is abrupt occlusion of the native proximal left SFA. Left SFA stent segment is completely occluded throughout the entire thigh. At the knee level, the native popliteal artery is reconstituted. Popliteal artery demonstrates atherosclerotic changes and is small caliber but appears patent. Runoff: Below the knee, the anterior tibial artery is chronically occluded. Tibial peroneal trunk is disease but patent. Dominant runoff vessel to the left lower extremity is the posterior tibial artery. Peroneal artery is very small in caliber but patent to the ankle. Veins: Dedicated venous phase imaging not performed Review of the MIP images confirms the above findings. NON-VASCULAR Lower chest: Cardiomegaly without pericardial effusion. Vascular congestion and low lung volumes noted in the lower lobes. No pleural effusion. Hepatobiliary: Limited single phase arterial imaging. No large focal hepatic abnormality or intrahepatic biliary dilatation. Gallbladder and biliary system unremarkable. Common bile duct nondilated. Pancreas: Unremarkable. No pancreatic ductal dilatation or surrounding inflammatory changes. Spleen: Normal in size without focal abnormality. Adrenals/Urinary Tract: Adrenal glands are unremarkable. Kidneys are normal, without renal calculi, focal lesion, or hydronephrosis. Bladder is unremarkable. Stomach/Bowel: Negative for bowel  obstruction, significant dilatation, ileus, free air. Appendix not visualized. Lymphatic: No bulky adenopathy. Reproductive: No significant finding by CT. Other: No inguinal or abdominal wall hernia.  No ascites. Musculoskeletal: No acute osseous finding. Lumbosacral degenerative changes. IMPRESSION: VASCULAR Extensive aortoiliac atherosclerotic changes without acute aortoiliac occlusion or significant acute iliac inflow abnormality. Abrupt occlusion of the left SFA native origin and the entire stented reconstruction of the left SFA, appearing acute. Left popliteal artery is reconstituted below the left SFA occlusion with dominant single-vessel runoff via the left posterior tibial artery. Abrupt occlusion of  the right profunda femoral artery also appearing acute. Right SFA extensive peripheral vascular disease but the right SFA stent reconstruction has intraluminal narrowing but appears overall patent. Patent single vessel right lower extremity runoff the at the right posterior tibial artery. NON-VASCULAR No other acute intra-abdominal or pelvic finding. These results were called by telephone at the time of interpretation on 01/13/2020 at 10:37 am to provider [facility] , who verbally acknowledged these results. Electronically Signed   By: Judie Petit.  Shick M.D.   On: 01/13/2020 10:39    ____________________________________________   PROCEDURES  Procedure(s) performed (including Critical Care):  .Critical Care Performed by: Gilles Chiquito, MD Authorized by: Gilles Chiquito, MD   Critical care provider statement:    Critical care time (minutes):  45   Critical care time was exclusive of:  Separately billable procedures and treating other patients   Critical care was necessary to treat or prevent imminent or life-threatening deterioration of the following conditions:  Circulatory failure   Critical care was time spent personally by me on the following activities:  Discussions with consultants,  evaluation of patient's response to treatment, examination of patient, ordering and performing treatments and interventions, ordering and review of laboratory studies, ordering and review of radiographic studies, pulse oximetry, re-evaluation of patient's condition, obtaining history from patient or surrogate and review of old charts     ____________________________________________   INITIAL IMPRESSION / ASSESSMENT AND PLAN / ED COURSE        Patient presents with above to history exam for assessment of paresthesias and pain in his left lower extremity in the setting of having stents placed in that leg and known PVD.  Exam patient is afebrile hemodynamically stable but does have significant coolness on palpation of his left lower extremity and absence of a dopplerable dorsalis pedis pulse on the left.  Right lower extremities unremarkable.  Concern for acute arterial occlusion.  High concern at this time for thrombosis of prior stent than embolization as patient has no history of A. fib is not currently in A. fib.  There is no history of trauma and there are no findings on exam or history of an acute infectious process.  After initial assessment immediately ordered heparin and spoke to CT to expedite patient CTA of his left lower extremity.  Also immediately consulted vascular surgeon Dr. Evie Lacks who after reviewing patient's CT and seeing patient emergency room recommended transfer to the Cath Lab with concerns for acute arterial occlusion.  Patient transferred to the Cath Lab in critical condition.        ____________________________________________   FINAL CLINICAL IMPRESSION(S) / ED DIAGNOSES  Final diagnoses:  Arterial occlusion  Hyperglycemia  Abnormal ECG    Medications  heparin ADULT infusion 100 units/mL (25000 units/233mL sodium chloride 0.45%) (1,300 Units/hr Intravenous New Bag/Given 01/13/20 0923)  insulin aspart (novoLOG) injection 0-15 Units (3 Units Subcutaneous  Given 01/13/20 1040)  acetaminophen (TYLENOL) tablet 1,000 mg (has no administration in time range)  fentaNYL (SUBLIMAZE) injection 50 mcg (50 mcg Intravenous Given 01/13/20 0854)  iohexol (OMNIPAQUE) 350 MG/ML injection 125 mL (125 mLs Intravenous Contrast Given 01/13/20 0901)  heparin bolus via infusion 5,000 Units (5,000 Units Intravenous Bolus from Bag 01/13/20 0923)  lactated ringers bolus 500 mL (500 mLs Intravenous New Bag/Given 01/13/20 1026)     ED Discharge Orders    None       Note:  This document was prepared using Dragon voice recognition software and may include unintentional dictation errors.  Gilles Chiquito, MD 01/13/20 1012    Gilles Chiquito, MD 01/13/20 1046

## 2020-01-13 NOTE — ED Notes (Signed)
Pt presents to ED with c/o of L foot pain that started at 1400 yesterday, pt states pain is progressively getting worse. Pt states HX of stents placed due to decreased blood flow. L foot is significantly cooler than R foot. L pedal pulse and L posterior popliteal could not be found on doppler, L femoral pulse found via doppler. Pt is A&Ox4. Pt denies SOB or chest pain. Pt is A&Ox4. Wife at bedside. IV placed for CT study.

## 2020-01-13 NOTE — Consult Note (Signed)
NAME:  Francisco Medina, MRN:  [id number], DOB:  1955/12/14, LOS: 0 ADMISSION DATE:  01/13/2020, CONSULTATION DATE:  01/13/2020  REFERRING MD:  Dr Evie Lacks - Vascular surgeon, CHIEF COMPLAINT:  ISchemic leg ccm mgmt   Brief History   CCM consult follow-up ischemic leg postoperative management  History of present illness -from Dr. Sanda Klein, the patient and also review of the records  = 64 year old male well-known to the vascular surgeon with bilateral peripheral arterial disease with bilateral lower extremity stenting in May 2021. He has been compliant with his Plavix. On 01/12/2020 valsartan onset of left lower extremity pain numbness tingling and coolness. CT arteriogram showed occlusion of the left SFA and popliteal arteries and stents. No chest pain or palpitations or shortness of breath. Diagnosis wash Acute ischemic left lower extremity [thrombosis and occlusion of the left superficial femoral and popliteal arteries]. Noted to have -  Abrupt occlusion of the SFA at its takeoff from the common femoral artery profunda femoris was patent as well as the external iliac and common femoral artery.  There was some reconstitution at the below-knee popliteal artery and trifurcation with two-vessel runoff with the posterior tibial appearing to be the dominant vessel ->  with status post mechanical thrombectomy and placement of thrombolysis catheter along with placement of right femoral venous central line. He was then returned to the intensive care unit where PCCM was consulted. At this point in time he is endorsing pain in the left lower extremity. He is resting not on the ventilator not on pressors.  Also noted to have - Thrombus noted in RIGHT profunda- currently asymptomatic but has limb threatening ischemia of Left leg.  On IV heparin gtt heparin    Past Medical History  Has significant CAD - cath 2020  Prox Graft lesion is 60% stenosed.  Mid Graft to Dist Graft lesion is 80% stenosed.  Dist  Graft lesion is 85% stenosed.  Dist Graft to Insertion lesion is 85% stenosed.  Origin to Prox Graft lesion is 100% stenosed.  Prox RCA to Mid RCA lesion is 100% stenosed.  Prox LAD lesion is 100% stenosed.  Mid LM lesion is 65% stenosed.   Three vessel native cad Patent lima to lad Occluded svg to d1 Diffusely diseased svg to rca    has a past medical history of CHF (congestive heart failure) (HCC), Diabetes mellitus without complication (HCC), and Hypertension.   reports that he has quit smoking. He has a 10.00 pack-year smoking history. He has never used smokeless tobacco.  Past Surgical History:  Procedure Laterality Date  . APPENDECTOMY    . CORONARY ARTERY BYPASS GRAFT    . LEFT HEART CATH AND CORS/GRAFTS ANGIOGRAPHY N/A 04/17/2018   Procedure: LEFT HEART CATH AND CORS/GRAFTS ANGIOGRAPHY;  Surgeon: Dalia Heading, MD;  Location: [facility];  Service: Cardiovascular;  Laterality: N/A;  . LOWER EXTREMITY ANGIOGRAPHY Right 12/11/2018   Procedure: LOWER EXTREMITY ANGIOGRAPHY;  Surgeon: Annice Needy, MD;  Location: [facility];  Service: Cardiovascular;  Laterality: Right;  . LOWER EXTREMITY ANGIOGRAPHY Left 12/18/2018   Procedure: LOWER EXTREMITY ANGIOGRAPHY;  Surgeon: Annice Needy, MD;  Location: [facility];  Service: Cardiovascular;  Laterality: Left;  . LOWER EXTREMITY ANGIOGRAPHY Left 07/23/2019   Procedure: LOWER EXTREMITY ANGIOGRAPHY;  Surgeon: Annice Needy, MD;  Location: [facility];  Service: Cardiovascular;  Laterality: Left;  . LOWER EXTREMITY ANGIOGRAPHY Right 07/30/2019   Procedure: LOWER EXTREMITY ANGIOGRAPHY;  Surgeon: Festus Barren  S, MD;  Location: [facility];  Service: Cardiovascular;  Laterality: Right;    Allergies  Allergen Reactions  . Atorvastatin Other (See Comments)    Other reaction(s): Muscle Pain     There is no immunization history on file for this patient.  Family History  Family history unknown:  Yes     Current Facility-Administered Medications:  .  0.9 %  sodium chloride infusion, 250 mL, Intravenous, PRN, Esco, Miechia A, MD .  acetaminophen (TYLENOL) tablet 1,000 mg, 1,000 mg, Oral, Once, Gilles Chiquito, MD .  alteplase (CATHFLO ACTIVASE) injection 6 mg, 6 mg, Intracatheter, Once, Esco, Miechia A, MD .  alteplase (LIMB ISCHEMIA) 10 mg in normal saline (0.02 mg/mL) infusion, 0.5 mg/hr, Intracatheter, Continuous, Esco, Miechia A, MD, Last Rate: 25 mL/hr at 01/13/20 1319, 0.5 mg/hr at 01/13/20 1319 .  carvedilol (COREG) tablet 6.25 mg, 6.25 mg, Oral, BID, Esco, Miechia A, MD .  colchicine tablet 0.6 mg, 0.6 mg, Oral, Daily PRN, Esco, Miechia A, MD .  fentaNYL (SUBLIMAZE) 100 MCG/2ML injection, , , ,  .  fluticasone (FLONASE) 50 MCG/ACT nasal spray 2 spray, 2 spray, Each Nare, Daily PRN, Esco, Miechia A, MD .  Melene Muller ON 01/14/2020] furosemide (LASIX) tablet 40 mg, 40 mg, Oral, Daily, Esco, Miechia A, MD .  heparin ADULT infusion 100 units/mL (25000 units/233mL sodium chloride 0.45%), 800 Units/hr, Intravenous, Continuous, Esco, Miechia A, MD, Last Rate: 8 mL/hr at 01/13/20 1317, 800 Units/hr at 01/13/20 1317 .  heparin sodium (porcine) 1000 UNIT/ML injection, , , ,  .  hydrALAZINE (APRESOLINE) injection 5 mg, 5 mg, Intravenous, Q20 Min PRN, Esco, Miechia A, MD .  HYDROmorphone (DILAUDID) injection 1 mg, 1 mg, Intravenous, Q2H PRN, Esco, Miechia A, MD .  insulin aspart (novoLOG) injection 0-15 Units, 0-15 Units, Subcutaneous, Q4H, Gilles Chiquito, MD, 3 Units at 01/13/20 1040 .  [START ON 01/14/2020] isosorbide mononitrate (IMDUR) 24 hr tablet 30 mg, 30 mg, Oral, Daily, Esco, Miechia A, MD .  labetalol (NORMODYNE) injection 10 mg, 10 mg, Intravenous, Q10 min PRN, Esco, Miechia A, MD .  latanoprost (XALATAN) 0.005 % ophthalmic solution 1 drop, 1 drop, Both Eyes, QHS, Esco, Miechia A, MD .  Melene Muller ON 01/14/2020] losartan (COZAAR) tablet 100 mg, 100 mg, Oral, Daily, Esco, Miechia A, MD .   metoprolol tartrate (LOPRESSOR) injection 2-5 mg, 2-5 mg, Intravenous, Q2H PRN, Esco, Miechia A, MD .  midazolam (VERSED) 5 MG/5ML injection, , , ,  .  midazolam (VERSED) injection 1 mg, 1 mg, Intravenous, Q1H PRN, Esco, Miechia A, MD .  morphine 4 MG/ML injection 5 mg, 5 mg, Intravenous, Q1H PRN, Esco, Miechia A, MD .  nitroGLYCERIN (NITROSTAT) SL tablet 0.4 mg, 0.4 mg, Sublingual, Q5 min PRN, Esco, Miechia A, MD .  nitroGLYCERIN CR capsule 2.5 mg, 2.5 mg, Oral, Daily, Esco, Miechia A, MD .  ondansetron (ZOFRAN) injection 4 mg, 4 mg, Intravenous, Q6H PRN, Esco, Miechia A, MD .  sodium chloride flush (NS) 0.9 % injection 3 mL, 3 mL, Intravenous, Q12H, Esco, Miechia A, MD, 3 mL at 01/13/20 1538 .  sodium chloride flush (NS) 0.9 % injection 3 mL, 3 mL, Intravenous, PRN, Esco, Miechia A, MD   Significant Hospital Events   admitted to hospital  - 01/13/2020  With Acute ischemic left lower extremity [thrombosis and occlusion of the left superficial femoral and popliteal arteries] with status post mechanical thrombectomy and placement of thrombolysis catheter along with placement of right femoral venous central  line.  Consults:  01/13/2020: CCM consult  Procedures:  01/13/2020 - Procedure(s) Performed: 1. Ultrasound guidance for vascular access right femoral artery 2. Catheter placement into left common femoral artery 3. Aortogram and selective left lower extremity angiogram 4.  Penumbra mechanical thrombectomy using a CAT 7 device             5.  Placement of thrombolysis catheter             6.  Placement of right femoral vein central line   Significant Diagnostic Tests:  x  Micro Data:  x  Antimicrobials:   Anti-infectives (From admission, onward)   Start     Dose/Rate Route Frequency Ordered Stop   01/13/20 1200  ceFAZolin (ANCEF) IVPB 2g/100 mL premix        2 g 200 mL/hr over 30 Minutes Intravenous  Once 01/13/20 1152 01/13/20  1531        Interim history/subjective:  11/7 - seen in ICU post propcedure by CCM  Objective   Blood pressure (!) 147/72, pulse 76, temperature 98.9 F (37.2 C), temperature source Oral, resp. rate (!) 22, height  (1.727 m), weight 80.7 kg, SpO2 96 %.       No intake or output data in the 24 hours ending 01/13/20 1702 Filed Weights   01/13/20 0846  Weight: 80.7 kg    Examination: General: resting supine in bed.  HENT: not on vent. Moans Lungs: CTA bilaterally Cardiovascular: sinus, No murmur Abdomen: soft Extremities: both exrremtities are now warm. R femoral venous cath + Neuro: alert and orietned x 3 +. Moans in pain GU: not examined   LABS    PULMONARY No results for input(s): PHART, PCO2ART, PO2ART, HCO3, TCO2, O2SAT in the last 168 hours.  Invalid input(s): PCO2, PO2  CBC Recent Labs  Lab 01/13/20 0849 01/13/20 1354  HGB 13.4 12.2*  HCT 40.2 37.4*  WBC 10.8* 8.9  PLT 213 187    COAGULATION Recent Labs  Lab 01/13/20 0849  INR 1.0    CARDIAC  No results for input(s): TROPONINI in the last 168 hours. No results for input(s): PROBNP in the last 168 hours.   CHEMISTRY Recent Labs  Lab 01/13/20 0849  NA 138  K 4.0  CL 103  CO2 23  GLUCOSE 204*  BUN 17  CREATININE 1.13  CALCIUM 9.4   Estimated Creatinine Clearance: 63.9 mL/min (by C-G formula based on SCr of 1.13 mg/dL).   LIVER Recent Labs  Lab 01/13/20 0849  AST 18  ALT 18  ALKPHOS 70  BILITOT 1.1  PROT 7.6  ALBUMIN 4.4  INR 1.0     INFECTIOUS No results for input(s): LATICACIDVEN, PROCALCITON in the last 168 hours.   ENDOCRINE CBG (last 3)  Recent Labs    01/13/20 1249 01/13/20 1344 01/13/20 1551  GLUCAP 93 91 115*         IMAGING x48h  - image(s) personally visualized  -   highlighted in bold CT ANGIO AO+BIFEM W & OR WO CONTRAST  Result Date: 01/13/2020 CLINICAL DATA:  Peripheral vascular disease, left foot pain progressively getting worse.  Concern for ischemia. EXAM: CT ANGIOGRAPHY OF ABDOMINAL AORTA WITH ILIOFEMORAL RUNOFF TECHNIQUE: Multidetector CT imaging of the abdomen, pelvis and lower extremities was performed using the standard protocol during bolus administration of intravenous contrast. Multiplanar CT image reconstructions and MIPs were obtained to evaluate the vascular anatomy. CONTRAST:  OMNIPAQUE IOHEXOL 350 MG/ML SOLN COMPARISON:  None available FINDINGS: VASCULAR Aorta:  Aortic atherosclerosis noted, most severe of the infrarenal aorta and bifurcation without occlusion, acute dissection, aneurysm, evidence of rupture or retroperitoneal hematoma. Celiac: Atherosclerotic origin but remains patent including its branches SMA: Atherosclerotic origin but remains patent including its branches Renals: Atherosclerotic origins with mild ostial narrowings but remain patent. No accessory renal artery. IMA: Remains patent off the distal aorta including its branches RIGHT Lower Extremity Inflow: Marked atherosclerotic change but the right common, internal, and external iliac arteries remain patent with areas of mild luminal narrowing. No significant right iliac inflow disease or occlusion. Outflow: Right common femoral artery is atherosclerotic but remains patent. Origin of the right profunda femoral artery is patent, however there is abrupt occlusion of the right profunda femoral artery in the deep thigh suggesting thrombo embolus. Right SFA is patent proximally with luminal irregularity and narrowing noted just proximal to the mid SFA stent origin, image 188 series 4. Right SFA lumen is narrowed greater than 50% but without occlusion. Right SFA stent has intra stent intimal thickening changes and areas of intra stent luminal narrowing but remains patent. Stented segments of the right popliteal artery extending below the knee appear to remain patent. Runoff: Below the knee, the tibial peroneal trunk remains patent. Proximal anterior tibial  artery is occluded heavily calcified. Peroneal artery tapers to occlusion proximally. Dominant runoff to the right lower extremity is the posterior tibial artery to the ankle. LEFT Lower Extremity Inflow: Left common iliac stent is patent. Extensive left iliac system atherosclerotic change but without acute occlusion or significant inflow abnormality. No thrombus evident. Outflow: Heavily calcified but patent left common femoral and profunda femoral arteries. There is abrupt occlusion of the native proximal left SFA. Left SFA stent segment is completely occluded throughout the entire thigh. At the knee level, the native popliteal artery is reconstituted. Popliteal artery demonstrates atherosclerotic changes and is small caliber but appears patent. Runoff: Below the knee, the anterior tibial artery is chronically occluded. Tibial peroneal trunk is disease but patent. Dominant runoff vessel to the left lower extremity is the posterior tibial artery. Peroneal artery is very small in caliber but patent to the ankle. Veins: Dedicated venous phase imaging not performed Review of the MIP images confirms the above findings. NON-VASCULAR Lower chest: Cardiomegaly without pericardial effusion. Vascular congestion and low lung volumes noted in the lower lobes. No pleural effusion. Hepatobiliary: Limited single phase arterial imaging. No large focal hepatic abnormality or intrahepatic biliary dilatation. Gallbladder and biliary system unremarkable. Common bile duct nondilated. Pancreas: Unremarkable. No pancreatic ductal dilatation or surrounding inflammatory changes. Spleen: Normal in size without focal abnormality. Adrenals/Urinary Tract: Adrenal glands are unremarkable. Kidneys are normal, without renal calculi, focal lesion, or hydronephrosis. Bladder is unremarkable. Stomach/Bowel: Negative for bowel obstruction, significant dilatation, ileus, free air. Appendix not visualized. Lymphatic: No bulky adenopathy. Reproductive:  No significant finding by CT. Other: No inguinal or abdominal wall hernia.  No ascites. Musculoskeletal: No acute osseous finding. Lumbosacral degenerative changes. IMPRESSION: VASCULAR Extensive aortoiliac atherosclerotic changes without acute aortoiliac occlusion or significant acute iliac inflow abnormality. Abrupt occlusion of the left SFA native origin and the entire stented reconstruction of the left SFA, appearing acute. Left popliteal artery is reconstituted below the left SFA occlusion with dominant single-vessel runoff via the left posterior tibial artery. Abrupt occlusion of the right profunda femoral artery also appearing acute. Right SFA extensive peripheral vascular disease but the right SFA stent reconstruction has intraluminal narrowing but appears overall patent. Patent single vessel right lower extremity runoff the at the  right posterior tibial artery. NON-VASCULAR No other acute intra-abdominal or pelvic finding. These results were called by telephone at the time of interpretation on 01/13/2020 at 10:37 am to provider [facility] SMITH , who verbally acknowledged these results. Electronically Signed   By: Judie PetitM.  Shick M.D.   On: 01/13/2020 10:39   DG Chest Port 1 View  Result Date: 01/13/2020 CLINICAL DATA:  Chronic cough, CHF, hypertension, history of coronary artery disease with stenting, former smoker, type II diabetes mellitus EXAM: PORTABLE CHEST 1 VIEW COMPARISON:  Portable exam 1116 hours compared to 03/28/2013 FINDINGS: Normal heart size post CABG. Mediastinal contours and pulmonary vascularity normal. Lungs clear. No acute infiltrate, pleural effusion, or pneumothorax. Osseous structures unremarkable. IMPRESSION: Post CABG. No acute abnormalities. Electronically Signed   By: Ulyses SouthwardMark  Boles M.D.   On: 01/13/2020 11:52     Resolved Hospital Problem list   x  Assessment & Plan:  ASSESSMENT / PLAN:  PULMONARY  A:  Hx of smoking. No resp distress. Benign CT Juy 2021 but has  emphysema    P:   BD    NEUROLOGIC A:   Critical limb ischemia - pain P:   fent prn  RASS goal 0 to -2    VASCULAR A:   Known PAD on Plavix Acute limb iscemia - presenting dx 01/13/2020   P:  Per VAscular Dr Evie LacksEsco IV heparin gtt  CARDIAC STRUCTURAL A: Known severe CAD an  P: Check ECHO Serial trop  CARDIAC ELECTRICAL A: RBBB on EKG   P: Repeat EKG 01/14/20  INFECTIOUS A:   No evideence of infection P:   monitor  RENAL A:  At risk for AKI. Currently creat normal  P:  Hydrate and monitor  ELECTROLYTES A:  At risk for electrolyte imbalance P: Goak K > 4 Goal mag > 2   GASTROINTESTINAL A:   At risk for stress gastritis  P:   h2 blockade  HEMATOLOGIC   - HEME A:  Anemia of ICU and risk for bleeding   P:  - PRBC for hgb </= 6.9gm%    - exceptions are   -  if ACS susepcted/confirmed then transfuse for hgb </= 8.0gm%,  or    -  active bleeding with hemodynamic instability, then transfuse regardless of hemoglobin value   At at all times try to transfuse 1 unit prbc as possible with exception of active hemorrhage   HEMATOLOGIC - Platelets A At risk for low platelets with heparin  P monitor  ENDOCRINE A:   At risk for low and high sugar   P:   ssi     Best practice:  Diet: per primar - syuspect ok for clear and meds Pain/Anxiety/Delirium protocol (if indicated): fent prn VAP protocol (if indicated): na DVT prophylaxis: iv heparin GI prophylaxis: h2 blockad3 Glucose control: ssi Mobility: bed rest Code Status: full Family Communication: per primary service Disposition: ICU     ATTESTATION & SIGNATURE   The patient Dyann KiefGregory B Tolley is critically ill with multiple organ systems failure and requires high complexity decision making for assessment and support, frequent evaluation and titration of therapies, application of advanced monitoring technologies and extensive interpretation of multiple databases.    Critical Care Time devoted to patient care services described in this note is  45  Minutes. This time reflects time of care of this signee Dr Kalman ShanMurali Novi Calia. This critical care time does not reflect procedure time, or teaching time or supervisory time of PA/NP/Med student/Med Resident etc but could  involve care discussion time     Dr. Kalman Shan, M.D., [facility].C.P Pulmonary and Critical Care Medicine Staff Physician [facility] [place] [facility] Pager: [phone], If no answer or between  15:00h - 7:00h: call [phone]  01/13/2020 5:02 PM

## 2020-01-13 NOTE — Progress Notes (Signed)
Attempted to reach wife Francisco Medina via cell and home phone to provide update. No answer at either. Will attempt again later.

## 2020-01-13 NOTE — Progress Notes (Signed)
Central line site and penis oozing blood, notified Dr. Evie Lacks of issue, received order for ptt. Will continue to monitor closely

## 2020-01-13 NOTE — H&P (Signed)
[facility] VASCULAR & VEIN SPECIALISTS Admission History & Physical  MRN : [id number]  Francisco Medina is a 64 y.o. (03-Dec-1955) male who presents with chief complaint of  Chief Complaint  Patient presents with  . Foot pain    L foot  .  History of Present Illness: Patient known to service history of bilateral lower extremity stenting for PAD in May 2021. Recent ABI 1.0 in September. Patient has been compliant with Plavix. Patient states he began feeling unwell a few days ago with cold like symptoms- cough, fatigue. Yesterday(01/12/2020)  ~2;00 pm he developed sudden LEFT leg pain, numbness and tingling. Coolness of the foot and difficulty ambulating. He awoke this morning with worsening left leg and foot pain/numbness and presented to the ED for evaluation. CT obtained revealed occlusion of the LEFT SFA and popliteal arteries and stents. Notes minimal claudication of both lower extremities, denies prior rest pain, denies chest pain or palpitations.  Current Facility-Administered Medications  Medication Dose Route Frequency Provider Last Rate Last Admin  . acetaminophen (TYLENOL) tablet 1,000 mg  1,000 mg Oral Once Gilles Chiquito, MD      . heparin ADULT infusion 100 units/mL (25000 units/258mL sodium chloride 0.45%)  1,300 Units/hr Intravenous Continuous Gilles Chiquito, MD 13 mL/hr at 01/13/20 0923 1,300 Units/hr at 01/13/20 0923  . insulin aspart (novoLOG) injection 0-15 Units  0-15 Units Subcutaneous Q4H Gilles Chiquito, MD   3 Units at 01/13/20 1040   Current Outpatient Medications  Medication Sig Dispense Refill  . acetaminophen (TYLENOL) 325 MG tablet Take 325 mg by mouth every 6 (six) hours as needed.     Marland Kitchen aspirin 325 MG tablet Take 325 mg by mouth at bedtime.    . carvedilol (COREG) 6.25 MG tablet Take 6.25 mg by mouth 2 (two) times daily.    . clopidogrel (PLAVIX) 75 MG tablet Take 75 mg by mouth daily.    . colchicine 0.6 MG tablet Take 0.6 mg by mouth daily as needed  (gout).     Tery Sanfilippo Sodium (DSS) 100 MG CAPS Take 100 mg by mouth at bedtime.     . fluticasone (FLONASE) 50 MCG/ACT nasal spray Place 2 sprays into both nostrils daily as needed for allergies.    . furosemide (LASIX) 40 MG tablet Take 40 mg by mouth daily.    Marland Kitchen glipiZIDE (GLUCOTROL XL) 5 MG 24 hr tablet Take 5 mg by mouth daily.    . isosorbide mononitrate (IMDUR) 30 MG 24 hr tablet Take 30 mg by mouth daily.    Marland Kitchen JANUVIA 100 MG tablet Take 100 mg by mouth at bedtime.     Marland Kitchen latanoprost (XALATAN) 0.005 % ophthalmic solution Place 1 drop into both eyes at bedtime.    Marland Kitchen losartan (COZAAR) 100 MG tablet Take 100 mg by mouth daily.    . metFORMIN (GLUCOPHAGE) 1000 MG tablet Take 1,000 mg by mouth 2 (two) times daily with a meal.    . nitroGLYCERIN (NITROSTAT) 0.4 MG SL tablet Place 0.4 mg under the tongue every 5 (five) minutes as needed for chest pain.     . nitroGLYCERIN 2.5 MG CR capsule Take 2.5 mg by mouth daily.     . potassium chloride (K-DUR) 10 MEQ tablet Take 10 mEq by mouth daily.    . rosuvastatin (CRESTOR) 20 MG tablet Take 20 mg by mouth at bedtime.     . timolol (TIMOPTIC) 0.5 % ophthalmic solution Place 1 drop into both eyes every morning.  Past Medical History:  Diagnosis Date  . CHF (congestive heart failure) (HCC)   . Hypertension     Past Surgical History:  Procedure Laterality Date  . LEFT HEART CATH AND CORS/GRAFTS ANGIOGRAPHY N/A 04/17/2018   Procedure: LEFT HEART CATH AND CORS/GRAFTS ANGIOGRAPHY;  Surgeon: Dalia HeadingFath, Kenneth A, MD;  Location: [facility] INVASIVE CV LAB;  Service: Cardiovascular;  Laterality: N/A;  . LOWER EXTREMITY ANGIOGRAPHY Right 12/11/2018   Procedure: LOWER EXTREMITY ANGIOGRAPHY;  Surgeon: Annice Needyew, Jason S, MD;  Location: [facility] INVASIVE CV LAB;  Service: Cardiovascular;  Laterality: Right;  . LOWER EXTREMITY ANGIOGRAPHY Left 12/18/2018   Procedure: LOWER EXTREMITY ANGIOGRAPHY;  Surgeon: Annice Needyew, Jason S, MD;  Location: [facility] INVASIVE CV LAB;  Service:  Cardiovascular;  Laterality: Left;  . LOWER EXTREMITY ANGIOGRAPHY Left 07/23/2019   Procedure: LOWER EXTREMITY ANGIOGRAPHY;  Surgeon: Annice Needyew, Jason S, MD;  Location: [facility] INVASIVE CV LAB;  Service: Cardiovascular;  Laterality: Left;  . LOWER EXTREMITY ANGIOGRAPHY Right 07/30/2019   Procedure: LOWER EXTREMITY ANGIOGRAPHY;  Surgeon: Annice Needyew, Jason S, MD;  Location: [facility] INVASIVE CV LAB;  Service: Cardiovascular;  Laterality: Right;     Social History   Tobacco Use  . Smoking status: Former Smoker    Packs/day: 1.00    Years: 10.00    Pack years: 10.00  . Smokeless tobacco: Never Used  Vaping Use  . Vaping Use: Never used  Substance Use Topics  . Alcohol use: Not Currently  . Drug use: Not Currently     Family History  Family history unknown: Yes    Allergies  Allergen Reactions  . Atorvastatin Other (See Comments)    Other reaction(s): Muscle Pain     REVIEW OF SYSTEMS (Negative unless checked)  Constitutional: [] Weight loss  [] Fever  [x] Chills Cardiac: [] Chest pain   [] Chest pressure   [] Palpitations   [] Shortness of breath when laying flat   [] Shortness of breath at rest   [x] Shortness of breath with exertion. Vascular:  [x] Pain in legs with walking   [x] Pain in legs at rest   [] Pain in legs when laying flat   [] Claudication   [] Pain in feet when walking  [] Pain in feet at rest  [] Pain in feet when laying flat   [] History of DVT   [] Phlebitis   [] Swelling in legs   [] Varicose veins   [] Non-healing ulcers Pulmonary:   [] Uses home oxygen   [x] Productive cough   [] Hemoptysis   [] Wheeze  [] COPD   [] Asthma Neurologic:  [] Dizziness  [] Blackouts   [] Seizures   [] History of stroke   [] History of TIA  [] Aphasia   [] Temporary blindness   [] Dysphagia   [] Weakness or numbness in arms   [] Weakness or numbness in legs Musculoskeletal:  [] Arthritis   [] Joint swelling   [] Joint pain   [] Low back pain Hematologic:  [] Easy bruising  [] Easy bleeding   [] Hypercoagulable state   [] Anemic   [] Hepatitis Gastrointestinal:  [] Blood in stool   [] Vomiting blood  [] Gastroesophageal reflux/heartburn   [] Difficulty swallowing. Genitourinary:  [] Chronic kidney disease   [] Difficult urination  [] Frequent urination  [] Burning with urination   [] Blood in urine Skin:  [] Rashes   [] Ulcers   [] Wounds Psychological:  [] History of anxiety   []  History of major depression.  Physical Examination  Vitals:   01/13/20 0845 01/13/20 0846 01/13/20 1000 01/13/20 1015  BP: 124/62  (!) 93/56 120/83  Pulse: 83  78 79  Resp: 19  16 18   Temp: 99.1 F (37.3 C)     TempSrc: Oral  SpO2: 98%  95% 93%  Weight:  80.7 kg    Height:  5\' 8"  (1.727 m)     Body mass index is 27.06 kg/m. Gen: WD/WN, NAD Head: /AT, No temporalis wasting. Prominent temp pulse not noted. Neck: Trachea midline.  No JVD.  Pulmonary:  Good air movement, respirations not labored, no use of accessory muscles, mild rhonchi, cough.  Cardiac: RRR, normal S1, S2. Vascular:  Vessel Right Left  Radial Palpable Palpable              Femoral Palpable Palpable          PT  No doppler  DP Palpable No doppler   Gastrointestinal: soft, non-tender/non-distended. No guarding/reflex.  Musculoskeletal: M/S 5/5 throughout. LEFT lower Extremity with ischemic changes.  Cool, sluggish capillary refill, diminished motor and sensory Neurologic: Symmetrical.  Speech is fluent. Motor exam as listed above. Psychiatric: Judgment intact, Mood & affect appropriate for pt's clinical situation. Dermatologic: No rashes or ulcers noted.  No cellulitis or open wounds. Lymph : No Cervical, Axillary, or Inguinal lymphadenopathy.     CBC Lab Results  Component Value Date   WBC 10.8 (H) 01/13/2020   HGB 13.4 01/13/2020   HCT 40.2 01/13/2020   MCV 87.0 01/13/2020   PLT 213 01/13/2020    BMET    Component Value Date/Time   NA 138 01/13/2020 0849   NA 136 03/28/2013 1542   K 4.0 01/13/2020 0849   K 4.1 03/28/2013 1542   CL 103 01/13/2020  0849   CL 102 03/28/2013 1542   CO2 23 01/13/2020 0849   CO2 28 03/28/2013 1542   GLUCOSE 204 (H) 01/13/2020 0849   GLUCOSE 88 03/28/2013 1542   BUN 17 01/13/2020 0849   BUN 36 (H) 03/28/2013 1542   CREATININE 1.13 01/13/2020 0849   CREATININE 1.39 (H) 03/28/2013 1542   CALCIUM 9.4 01/13/2020 0849   CALCIUM 9.4 03/28/2013 1542   GFRNONAA >60 01/13/2020 0849   GFRNONAA 55 (L) 03/28/2013 1542   GFRAA >60 07/30/2019 0815   GFRAA >60 03/28/2013 1542   Estimated Creatinine Clearance: 63.9 mL/min (by C-G formula based on SCr of 1.13 mg/dL).  COAG Lab Results  Component Value Date   INR 1.0 01/13/2020    Radiology CT ANGIO AO+BIFEM W & OR WO CONTRAST  Result Date: 01/13/2020 CLINICAL DATA:  Peripheral vascular disease, left foot pain progressively getting worse. Concern for ischemia. EXAM: CT ANGIOGRAPHY OF ABDOMINAL AORTA WITH ILIOFEMORAL RUNOFF TECHNIQUE: Multidetector CT imaging of the abdomen, pelvis and lower extremities was performed using the standard protocol during bolus administration of intravenous contrast. Multiplanar CT image reconstructions and MIPs were obtained to evaluate the vascular anatomy. CONTRAST:  13/09/2019 OMNIPAQUE IOHEXOL 350 MG/ML SOLN COMPARISON:  None available FINDINGS: VASCULAR Aorta: Aortic atherosclerosis noted, most severe of the infrarenal aorta and bifurcation without occlusion, acute dissection, aneurysm, evidence of rupture or retroperitoneal hematoma. Celiac: Atherosclerotic origin but remains patent including its branches SMA: Atherosclerotic origin but remains patent including its branches Renals: Atherosclerotic origins with mild ostial narrowings but remain patent. No accessory renal artery. IMA: Remains patent off the distal aorta including its branches RIGHT Lower Extremity Inflow: Marked atherosclerotic change but the right common, internal, and external iliac arteries remain patent with areas of mild luminal narrowing. No significant right iliac  inflow disease or occlusion. Outflow: Right common femoral artery is atherosclerotic but remains patent. Origin of the right profunda femoral artery is patent, however there is abrupt occlusion of the right profunda  femoral artery in the deep thigh suggesting thrombo embolus. Right SFA is patent proximally with luminal irregularity and narrowing noted just proximal to the mid SFA stent origin, image 188 series 4. Right SFA lumen is narrowed greater than 50% but without occlusion. Right SFA stent has intra stent intimal thickening changes and areas of intra stent luminal narrowing but remains patent. Stented segments of the right popliteal artery extending below the knee appear to remain patent. Runoff: Below the knee, the tibial peroneal trunk remains patent. Proximal anterior tibial artery is occluded heavily calcified. Peroneal artery tapers to occlusion proximally. Dominant runoff to the right lower extremity is the posterior tibial artery to the ankle. LEFT Lower Extremity Inflow: Left common iliac stent is patent. Extensive left iliac system atherosclerotic change but without acute occlusion or significant inflow abnormality. No thrombus evident. Outflow: Heavily calcified but patent left common femoral and profunda femoral arteries. There is abrupt occlusion of the native proximal left SFA. Left SFA stent segment is completely occluded throughout the entire thigh. At the knee level, the native popliteal artery is reconstituted. Popliteal artery demonstrates atherosclerotic changes and is small caliber but appears patent. Runoff: Below the knee, the anterior tibial artery is chronically occluded. Tibial peroneal trunk is disease but patent. Dominant runoff vessel to the left lower extremity is the posterior tibial artery. Peroneal artery is very small in caliber but patent to the ankle. Veins: Dedicated venous phase imaging not performed Review of the MIP images confirms the above findings. NON-VASCULAR Lower  chest: Cardiomegaly without pericardial effusion. Vascular congestion and low lung volumes noted in the lower lobes. No pleural effusion. Hepatobiliary: Limited single phase arterial imaging. No large focal hepatic abnormality or intrahepatic biliary dilatation. Gallbladder and biliary system unremarkable. Common bile duct nondilated. Pancreas: Unremarkable. No pancreatic ductal dilatation or surrounding inflammatory changes. Spleen: Normal in size without focal abnormality. Adrenals/Urinary Tract: Adrenal glands are unremarkable. Kidneys are normal, without renal calculi, focal lesion, or hydronephrosis. Bladder is unremarkable. Stomach/Bowel: Negative for bowel obstruction, significant dilatation, ileus, free air. Appendix not visualized. Lymphatic: No bulky adenopathy. Reproductive: No significant finding by CT. Other: No inguinal or abdominal wall hernia.  No ascites. Musculoskeletal: No acute osseous finding. Lumbosacral degenerative changes. IMPRESSION: VASCULAR Extensive aortoiliac atherosclerotic changes without acute aortoiliac occlusion or significant acute iliac inflow abnormality. Abrupt occlusion of the left SFA native origin and the entire stented reconstruction of the left SFA, appearing acute. Left popliteal artery is reconstituted below the left SFA occlusion with dominant single-vessel runoff via the left posterior tibial artery. Abrupt occlusion of the right profunda femoral artery also appearing acute. Right SFA extensive peripheral vascular disease but the right SFA stent reconstruction has intraluminal narrowing but appears overall patent. Patent single vessel right lower extremity runoff the at the right posterior tibial artery. NON-VASCULAR No other acute intra-abdominal or pelvic finding. These results were called by telephone at the time of interpretation on 01/13/2020 at 10:37 am to provider [facility] , who verbally acknowledged these results. Electronically Signed   By: Judie Petit.  Shick M.D.    On: 01/13/2020 10:39     Assessment/Plan 1. Acute ischemia of LEFT lower extremity; Thrombosis of LEFT SFA/Popliteal Stent 2: Will proceed with emergent LEFT lower extremity angiogram; thrombectomy and thrombolysis today. 3: Thrombus noted in RIGHT profunda- currently asymptomatic but has limb threatening ischemia of Left leg. Will continue heparin and monitor. Possible intervention tomorrow.  Patient with acute ischemia and threatened LEFT lower extremity. Risk of additional procedures and limb loss.Risk of  bleeding, infection, MI, respiratory failure, injury to surrounding structures. All questions answered. Understanding expressed. Consent obtained.   Bertram Denver, MD  01/13/2020 10:48 AM

## 2020-01-13 NOTE — ED Notes (Signed)
Advised nurse patient has  assigned bed °

## 2020-01-13 NOTE — ED Notes (Signed)
Pt states he urinated bed, this RN asked to change his bed and pt states "that's fine I don't need you too". Belongings sent with wife. Report to special recovery nurse, Aurther Loft.

## 2020-01-13 NOTE — Progress Notes (Signed)
ANTICOAGULATION CONSULT NOTE  Pharmacy Consult for heparin Indication: VTE treatment  Allergies  Allergen Reactions  . Atorvastatin Other (See Comments)    Other reaction(s): Muscle Pain    Patient Measurements: Height: 5\' 8"  (172.7 cm) Weight: 80.7 kg (178 lb) IBW/kg (Calculated) : 68.4 Heparin Dosing Weight: 80 kg  Vital Signs: Temp: 98.7 F (37.1 C) (11/07 2000) Temp Source: Oral (11/07 2000) BP: 121/68 (11/07 2000) Pulse Rate: 87 (11/07 2000)  Labs: Recent Labs    01/13/20 0849 01/13/20 0849 01/13/20 1354 01/13/20 1702 01/13/20 1749 01/13/20 1940 01/13/20 2104  HGB 13.4   < > 12.2* 11.8*  --   --   --   HCT 40.2  --  37.4* 36.0*  --   --   --   PLT 213  --  187 170  --   --   --   APTT 30  --   --   --   --   --  43*  LABPROT 13.2  --   --   --   --   --   --   INR 1.0  --   --   --   --   --   --   HEPARINUNFRC  --   --  0.14*  --   --  <0.10*  --   CREATININE 1.13  --   --   --   --   --   --   CKTOTAL  --   --   --   --  42*  --   --   TROPONINIHS  --   --   --   --  9 10 10    < > = values in this interval not displayed.    Estimated Creatinine Clearance: 63.9 mL/min (by C-G formula based on SCr of 1.13 mg/dL).   Medical History: Past Medical History:  Diagnosis Date  . CHF (congestive heart failure) (HCC)   . Diabetes mellitus without complication (HCC)   . Hypertension     Assessment: 65 year old male presented with foot pain, progressively worse. Patient known to service history of bilateral lower extremity stenting for PAD in May 2021. No anticoagulation noted PTA. CT obtained revealed occlusion of the LEFT SFA and popliteal arteries and stents. Pharmacy to start heparin dosing and monitoring. Baseline labs WNL. Patient to undergo thrombolysis today (11/7).  11/7 1940 HL <0.10, aPTT 43, RN notes blood oozing from catheter site  Goal of Therapy:  Heparin level 0.2-0.5 units/ml Monitor platelets by anticoagulation protocol: Yes   Plan:   Patient is currently on Heparin 800 units/hr and is also receiving tPA via thrombolysis catheter. RN noted some blood oozing from catheter site.   Goal heparin level 0.2-0.5 units/ml, will increase drip rate to 900 units/hr (a little more conservative than nomogram recommends)  Check HL in 6 hours after start of new rate and continue to monitor q6h x4 post procedure. CBC daily while on heparin drip.  02-26-1981, PharmD, BCPS 01/13/2020 9:58 PM

## 2020-01-14 ENCOUNTER — Encounter: Payer: Self-pay | Admitting: Vascular Surgery

## 2020-01-14 ENCOUNTER — Inpatient Hospital Stay (HOSPITAL_COMMUNITY)
Admit: 2020-01-14 | Discharge: 2020-01-14 | Disposition: A | Discharge status: Home / Self Care | Payer: Commercial Managed Care - PPO | Attending: Vascular Surgery | Admitting: Vascular Surgery

## 2020-01-14 ENCOUNTER — Encounter
Admission: EM | Disposition: A | Discharge status: Home / Self Care | Payer: Self-pay | Source: Home / Self Care | Attending: Vascular Surgery

## 2020-01-14 DIAGNOSIS — I70222 Atherosclerosis of native arteries of extremities with rest pain, left leg: Secondary | ICD-10-CM

## 2020-01-14 DIAGNOSIS — I342 Nonrheumatic mitral (valve) stenosis: Secondary | ICD-10-CM | POA: Diagnosis not present

## 2020-01-14 DIAGNOSIS — R9431 Abnormal electrocardiogram [ECG] [EKG]: Secondary | ICD-10-CM

## 2020-01-14 HISTORY — PX: LOWER EXTREMITY ANGIOGRAPHY: CATH118251

## 2020-01-14 LAB — CBC
HCT: 36.1 % — ABNORMAL LOW (ref 39.0–52.0)
HCT: 36.3 % — ABNORMAL LOW (ref 39.0–52.0)
Hemoglobin: 11.9 g/dL — ABNORMAL LOW (ref 13.0–17.0)
Hemoglobin: 12 g/dL — ABNORMAL LOW (ref 13.0–17.0)
MCH: 29 pg (ref 26.0–34.0)
MCH: 28.8 pg (ref 26.0–34.0)
MCHC: 33 g/dL (ref 30.0–36.0)
MCHC: 33.1 g/dL (ref 30.0–36.0)
MCV: 88 fL (ref 80.0–100.0)
MCV: 87.3 fL (ref 80.0–100.0)
Platelets: 154 10*3/uL (ref 150–400)
Platelets: 169 10*3/uL (ref 150–400)
RBC: 4.1 MIL/uL — ABNORMAL LOW (ref 4.22–5.81)
RBC: 4.16 MIL/uL — ABNORMAL LOW (ref 4.22–5.81)
RDW: 13.5 % (ref 11.5–15.5)
RDW: 13.5 % (ref 11.5–15.5)
WBC: 6.7 10*3/uL (ref 4.0–10.5)
WBC: 8.1 10*3/uL (ref 4.0–10.5)
nRBC: 0 % (ref 0.0–0.2)
nRBC: 0 % (ref 0.0–0.2)

## 2020-01-14 LAB — ECHOCARDIOGRAM COMPLETE
AR max vel: 2.26 cm2
AV Area VTI: 2.49 cm2
AV Area mean vel: 2.03 cm2
AV Mean grad: 3.5 mmHg
AV Peak grad: 6.1 mmHg
Ao pk vel: 1.24 m/s
Area-P 1/2: 3.36 cm2
Calc EF: 36.9 %
Height: 68 in
S' Lateral: 4.54 cm
Single Plane A2C EF: 39.1 %
Single Plane A4C EF: 34.9 %
Weight: 2848 oz

## 2020-01-14 LAB — GLUCOSE, CAPILLARY
Glucose-Capillary: 126 mg/dL — ABNORMAL HIGH (ref 70–99)
Glucose-Capillary: 132 mg/dL — ABNORMAL HIGH (ref 70–99)
Glucose-Capillary: 133 mg/dL — ABNORMAL HIGH (ref 70–99)
Glucose-Capillary: 141 mg/dL — ABNORMAL HIGH (ref 70–99)
Glucose-Capillary: 149 mg/dL — ABNORMAL HIGH (ref 70–99)
Glucose-Capillary: 160 mg/dL — ABNORMAL HIGH (ref 70–99)
Glucose-Capillary: 185 mg/dL — ABNORMAL HIGH (ref 70–99)

## 2020-01-14 LAB — PHOSPHORUS: Phosphorus: 4.1 mg/dL (ref 2.5–4.6)

## 2020-01-14 LAB — BASIC METABOLIC PANEL
Anion gap: 8 (ref 5–15)
BUN: 11 mg/dL (ref 8–23)
CO2: 26 mmol/L (ref 22–32)
Calcium: 8.7 mg/dL — ABNORMAL LOW (ref 8.9–10.3)
Chloride: 103 mmol/L (ref 98–111)
Creatinine, Ser: 0.76 mg/dL (ref 0.61–1.24)
GFR, Estimated: >60 mL/min (ref >60)
Glucose, Bld: 167 mg/dL — ABNORMAL HIGH (ref 70–99)
Potassium: 3.6 mmol/L (ref 3.5–5.1)
Sodium: 137 mmol/L (ref 135–145)

## 2020-01-14 LAB — APTT: aPTT: 43 s — ABNORMAL HIGH (ref 24–36)

## 2020-01-14 LAB — FIBRINOGEN: Fibrinogen: 406 mg/dL (ref 210–475)

## 2020-01-14 LAB — HEPARIN LEVEL (UNFRACTIONATED): Heparin Unfractionated: <0.1 [IU]/mL — ABNORMAL LOW (ref 0.30–0.70)

## 2020-01-14 LAB — LACTIC ACID, PLASMA: Lactic Acid, Venous: 1.2 mmol/L (ref 0.5–1.9)

## 2020-01-14 LAB — MAGNESIUM: Magnesium: 1.8 mg/dL (ref 1.7–2.4)

## 2020-01-14 SURGERY — LOWER EXTREMITY ANGIOGRAPHY
Anesthesia: Moderate Sedation | Laterality: Left

## 2020-01-14 MED ORDER — MIDAZOLAM HCL 2 MG/2ML IJ SOLN
INTRAMUSCULAR | Status: DC | PRN
  Administered 2020-01-14: 2 mg via INTRAVENOUS

## 2020-01-14 MED ORDER — OXYCODONE HCL 5 MG PO TABS
5.0000 mg | ORAL_TABLET | ORAL | Status: DC | PRN
  Administered 2020-01-14: 5 mg via ORAL
  Filled 2020-01-14: qty 1

## 2020-01-14 MED ORDER — INSULIN ASPART 100 UNIT/ML ~~LOC~~ SOLN
0.0000 [IU] | Freq: Three times a day (TID) | SUBCUTANEOUS | Status: DC
  Administered 2020-01-14: 3 [IU] via SUBCUTANEOUS
  Administered 2020-01-14 – 2020-01-15 (×2): 2 [IU] via SUBCUTANEOUS
  Filled 2020-01-14 (×3): qty 1

## 2020-01-14 MED ORDER — FENTANYL CITRATE (PF) 100 MCG/2ML IJ SOLN
INTRAMUSCULAR | Status: AC
  Filled 2020-01-14: qty 2

## 2020-01-14 MED ORDER — MORPHINE SULFATE (PF) 2 MG/ML IV SOLN: 2.0000 mg | INTRAVENOUS | Status: DC | PRN

## 2020-01-14 MED ORDER — IPRATROPIUM-ALBUTEROL 0.5-2.5 (3) MG/3ML IN SOLN
3.0000 mL | Freq: Three times a day (TID) | RESPIRATORY_TRACT | Status: DC
  Administered 2020-01-15: 3 mL via RESPIRATORY_TRACT
  Filled 2020-01-14 (×2): qty 3

## 2020-01-14 MED ORDER — SODIUM CHLORIDE 0.9 % IV SOLN: INTRAVENOUS | Status: DC

## 2020-01-14 MED ORDER — TIMOLOL MALEATE 0.5 % OP SOLN
1.0000 [drp] | Freq: Every day | OPHTHALMIC | Status: DC
  Administered 2020-01-14 – 2020-01-15 (×2): 1 [drp] via OPHTHALMIC
  Filled 2020-01-14 (×2): qty 5

## 2020-01-14 MED ORDER — INSULIN ASPART 100 UNIT/ML ~~LOC~~ SOLN: 0.0000 [IU] | Freq: Every day | SUBCUTANEOUS | Status: DC

## 2020-01-14 MED ORDER — TIROFIBAN HCL IN NACL 5-0.9 MG/100ML-% IV SOLN
0.1500 ug/kg/min | INTRAVENOUS | Status: DC
  Administered 2020-01-14 – 2020-01-15 (×3): 0.15 ug/kg/min via INTRAVENOUS
  Filled 2020-01-14 (×4): qty 100

## 2020-01-14 MED ORDER — TIROFIBAN (AGGRASTAT) BOLUS VIA INFUSION: 25.0000 ug/kg | Freq: Once | INTRAVENOUS | Status: AC

## 2020-01-14 MED ORDER — MIDAZOLAM HCL 5 MG/5ML IJ SOLN
INTRAMUSCULAR | Status: AC
  Filled 2020-01-14: qty 5

## 2020-01-14 MED ORDER — FENTANYL CITRATE (PF) 100 MCG/2ML IJ SOLN
INTRAMUSCULAR | Status: DC | PRN
  Administered 2020-01-14: 50 ug via INTRAVENOUS

## 2020-01-14 MED ORDER — HEPARIN SODIUM (PORCINE) 1000 UNIT/ML IJ SOLN
INTRAMUSCULAR | Status: AC
  Filled 2020-01-14: qty 1

## 2020-01-14 MED ORDER — LIVING WELL WITH DIABETES BOOK
Freq: Once | Status: AC
  Filled 2020-01-14: qty 1

## 2020-01-14 MED ORDER — IODIXANOL 320 MG/ML IV SOLN
INTRAVENOUS | Status: DC | PRN
  Administered 2020-01-14: 50 mL

## 2020-01-14 MED ORDER — HEPARIN SODIUM (PORCINE) 1000 UNIT/ML IJ SOLN
INTRAMUSCULAR | Status: DC | PRN
  Administered 2020-01-14: 3000 [IU] via INTRAVENOUS

## 2020-01-14 MED ORDER — KETOROLAC TROMETHAMINE 30 MG/ML IJ SOLN
30.0000 mg | Freq: Four times a day (QID) | INTRAMUSCULAR | Status: AC
  Administered 2020-01-14 – 2020-01-15 (×4): 30 mg via INTRAVENOUS
  Filled 2020-01-14 (×4): qty 1

## 2020-01-14 MED ORDER — TIROFIBAN HCL IV 12.5 MG/250 ML
INTRAVENOUS | Status: AC
  Administered 2020-01-14: 2017.5 ug via INTRAVENOUS
  Filled 2020-01-14: qty 250

## 2020-01-14 MED ORDER — SODIUM CHLORIDE 0.9 % IV SOLN
INTRAVENOUS | Status: DC
Start: 2020-01-15 — End: 2020-01-14

## 2020-01-14 MED ORDER — DOCUSATE SODIUM 100 MG PO CAPS: 100.0000 mg | ORAL_CAPSULE | Freq: Every day | ORAL | Status: DC

## 2020-01-14 MED ORDER — ROSUVASTATIN CALCIUM 10 MG PO TABS
20.0000 mg | ORAL_TABLET | Freq: Every day | ORAL | Status: DC
  Administered 2020-01-14: 20 mg via ORAL
  Filled 2020-01-14: qty 2

## 2020-01-14 SURGICAL SUPPLY — 12 items
BALLN LUTONIX 018 5X300X130 (BALLOONS) ×3
BALLN ULTRVRSE 2.5X300X150 (BALLOONS) ×3
BALLOON LUTONIX 018 5X300X130 (BALLOONS) ×1 IMPLANT
BALLOON ULTRVRSE 2.5X300X150 (BALLOONS) ×1 IMPLANT
CANISTER PENUMBRA ENGINE (MISCELLANEOUS) ×3 IMPLANT
CATH INDIGO CAT6 KIT (CATHETERS) ×3 IMPLANT
CHLORAPREP W/TINT 26 (MISCELLANEOUS) ×3 IMPLANT
DEVICE STARCLOSE SE CLOSURE (Vascular Products) ×3 IMPLANT
GLIDEWIRE ADV .035X260CM (WIRE) ×3 IMPLANT
KIT ENCORE 26 ADVANTAGE (KITS) ×3 IMPLANT
PACK ANGIOGRAPHY (CUSTOM PROCEDURE TRAY) ×3 IMPLANT
WIRE G V18X300CM (WIRE) ×3 IMPLANT

## 2020-01-14 NOTE — Interval H&P Note (Signed)
History and Physical Interval Note:  01/14/2020 7:42 AM  Francisco Medina  has presented today for surgery, with the diagnosis of Ischemia Left lower extremity.  The various methods of treatment have been discussed with the patient and family. After consideration of risks, benefits and other options for treatment, the patient has consented to  Procedure(s): Lower Extremity Angiography (Left) as a surgical intervention.  The patient's history has been reviewed, patient examined, no change in status, stable for surgery.  I have reviewed the patient's chart and labs.  Questions were answered to the patient's satisfaction.     Festus Barren

## 2020-01-14 NOTE — Op Note (Signed)
VASCULAR [facility] VEIN SPECIALISTS  Percutaneous Study/Intervention Procedural Note   Date of Surgery: 01/14/2020  Surgeon(s): Rehana Uncapher    Assistants: none  Pre-operative Diagnosis: PAD with rest pain with acute on chronic ischemia of the left lower extremity status post overnight thrombolytic therapy  Post-operative diagnosis:  Same  Procedure(s) Performed:             1.  Selective left lower extremity angiogram             2.  Catheter placement into left posterior tibial artery from right femoral approach             3.   Percutaneous transluminal angioplasty of left posterior tibial artery with 2.5 mm diameter by 30 cm length angioplasty balloon             4.  Percutaneous transluminal angioplasty of left SFA and popliteal arteries with 2 inflations with a 5 mm diameter by 30 cm length Lutonix drug-coated angioplasty balloon             5.   Mechanical thrombectomy with the penumbra cat 6 device to the left SFA and popliteal arteries  6.  StarClose closure device right femoral artery  EBL: 200 cc  Contrast: 50 cc  Fluoro Time: 3.6 minutes  Moderate Conscious Sedation Time: approximately 27 minutes using 2 mg of Versed and 50 mcg of Fentanyl              Indications:  Patient is a 64 y.o.male with an ischemic leg. The patient has been running tpa overnight. The patient is brought in for angiography for further evaluation and potential treatment.  Due to the limb threatening nature of the situation, angiogram was performed for attempted limb salvage. The patient is aware that if the procedure fails, amputation would be expected.  The patient also understands that even with successful revascularization, amputation may still be required due to the severity of the situation.  Risks and benefits are discussed and informed consent is obtained.   Procedure:  The patient was identified and appropriate procedural time out was performed.  The patient was then placed supine on the table  and prepped and draped in the usual sterile fashion. Moderate conscious sedation was administered during a face to face encounter with the patient throughout the procedure with my supervision of the RN administering medicines and monitoring the patient's vital signs, pulse oximetry, telemetry and mental status throughout from the start of the procedure until the patient was taken to the recovery room.  The existing thrombolytic catheter was removed over an advantage wire. Selective left lower extremity angiogram was then performed. This demonstrated 2 areas of narrowing of greater than 50% in the SFA 1 in the proximal SFA and one in the mid SFA.  The distal SFA and above-knee popliteal artery there was a nearly occlusive stenosis with thrombus present.  Below the stent, the popliteal artery seemed to normalize down to a tibioperoneal trunk.  The peroneal artery was continuous distally.  The posterior tibial artery had areas of short segment occlusion but there was some reconstitution with flow down into the foot. It was felt that it was in the patient's best interest to proceed with intervention after these images to avoid a second procedure and a larger amount of contrast and fluoroscopy based off of the findings from the initial angiogram. The patient was systemically heparinized. I then used a catheter and the advantage wire to navigate down into the mid to  distal posterior tibial artery where imaging showed intraluminal flow.  The advantage wire was then removed and a V 18 wire was placed.  The catheter was removed.  I performed balloon angioplasty of the posterior tibial artery with a 2.5 mm diameter by 30 cm length angioplasty balloon inflated to 8 atm for 1 minute.  The popliteal artery above the knee and the entirety of the SFA were then treated with 2 inflations of a 5 mm diameter by 30 cm length Lutonix drug-coated angioplasty balloon inflated to 10 atm for 1 minute.  Completion imaging showed residual  mobile thrombus in the distal SFA and above-knee popliteal artery, so I elected to treat this with thrombectomy.  The penumbra cat 6 device was brought on the field and 2 passes were made in the distal SFA and above-knee popliteal artery with resolution of the thrombus.  The SFA and popliteal arteries had mild disease with nothing greater than 25% residual stenosis after this and the flow was brisk.  The peroneal artery remained continuous.  The posterior tibial artery was now also continuous although there was significant spasm in the vessel in the mid to distal segments making it difficult to determine the degree of residual stenosis, but it appeared to be less than 40%. I elected to terminate the procedure. The sheath was removed and StarClose closure device was deployed in the right femoral artery with excellent hemostatic result. The patient was taken to the recovery room in stable condition having tolerated the procedure well.  Findings:                            Left lower Extremity:  This demonstrated 2 areas of narrowing of greater than 50% in the SFA 1 in the proximal SFA and one in the mid SFA.  The distal SFA and above-knee popliteal artery there was a nearly occlusive stenosis with thrombus present.  Below the stent, the popliteal artery seemed to normalize down to a tibioperoneal trunk.  The peroneal artery was continuous distally.  The posterior tibial artery had areas of short segment occlusion but there was some reconstitution with flow down into the foot   Disposition: Patient was taken to the recovery room in stable condition having tolerated the procedure well.  Complications: None  Leotis Pain 01/14/2020 8:34 AM   This note was created with Dragon Medical transcription system. Any errors in dictation are purely unintentional.

## 2020-01-14 NOTE — Progress Notes (Signed)
ANTICOAGULATION CONSULT NOTE  Pharmacy Consult for heparin Indication: VTE treatment  Allergies  Allergen Reactions  . Atorvastatin Other (See Comments)    Other reaction(s): Muscle Pain    Patient Measurements: Height: 5\' 8"  (172.7 cm) Weight: 80.7 kg (178 lb) IBW/kg (Calculated) : 68.4 Heparin Dosing Weight: 80 kg  Vital Signs: Temp: 98.8 F (37.1 C) (11/08 0400) Temp Source: Oral (11/08 0400) BP: 144/68 (11/08 0600) Pulse Rate: 72 (11/08 0600)  Labs: Recent Labs    01/13/20 0849 01/13/20 0849 01/13/20 1354 01/13/20 1354 01/13/20 1702 01/13/20 1702 01/13/20 1749 01/13/20 1940 01/13/20 2104 01/13/20 2315 01/14/20 0529  HGB 13.4   < > 12.2*   < > 11.8*   < >  --   --   --  11.5* 11.9*  HCT 40.2   < > 37.4*   < > 36.0*  --   --   --   --  35.2* 36.1*  PLT 213   < > 187   < > 170  --   --   --   --  150 154  APTT 30   < >  --   --   --   --   --   --  43* 46* 43*  LABPROT 13.2  --   --   --   --   --   --   --   --   --   --   INR 1.0  --   --   --   --   --   --   --   --   --   --   HEPARINUNFRC  --   --  0.14*  --   --   --   --  <0.10*  --   --  <0.10*  CREATININE 1.13  --   --   --   --   --   --   --   --   --  0.76  CKTOTAL  --   --   --   --   --   --  42*  --   --   --   --   TROPONINIHS  --   --   --   --   --   --  9 10 10   --   --    < > = values in this interval not displayed.    Estimated Creatinine Clearance: 90.3 mL/min (by C-G formula based on SCr of 0.76 mg/dL).   Medical History: Past Medical History:  Diagnosis Date  . CHF (congestive heart failure) (HCC)   . Diabetes mellitus without complication (HCC)   . Hypertension     Assessment: 64 year old male presented with foot pain, progressively worse. Patient known to service history of bilateral lower extremity stenting for PAD in May 2021. No anticoagulation noted PTA. CT obtained revealed occlusion of the LEFT SFA and popliteal arteries and stents. Pharmacy to start heparin dosing and  monitoring. Baseline labs WNL. Patient to undergo thrombolysis today (11/7).  11/7 1940 HL <0.10, aPTT 43, RN notes blood oozing from catheter site  11/7: HL @ 0529 = < 0.1,  APTT = 43  Goal of Therapy:  Heparin level 0.2-0.5 units/ml Monitor platelets by anticoagulation protocol: Yes   Plan:  Patient is currently on Heparin 800 units/hr and is also receiving tPA via thrombolysis catheter. RN noted some blood oozing from catheter site.   Goal heparin level 0.2-0.5 units/ml,  will increase drip rate to 900 units/hr (a little more conservative than nomogram recommends)  11/8: HL @ 0529 = < 0.1 , aPTT = 43 Will increase heparin drip to 1050 units/hr and recheck HL 6 hrs after rate change.   Jerris Keltz D 01/14/2020 6:45 AM

## 2020-01-14 NOTE — Progress Notes (Signed)
Have been monitoring central line and sheath sites throughout the night, minor oozing at central line site, I reinforced the gauze dressing there. Keeping patient flat and as still as possible. L foot is now warm, but unable to palpate or doppler a pulse yet. Will continue to monitor.

## 2020-01-14 NOTE — Progress Notes (Signed)
*  PRELIMINARY RESULTS* Echocardiogram 2D Echocardiogram has been performed.  Cristela Blue 01/14/2020, 11:44 AM

## 2020-01-14 NOTE — Progress Notes (Signed)
Inpatient Diabetes Program Recommendations  AACE/ADA: New Consensus Statement on Inpatient Glycemic Control (2015)  Target Ranges:  Prepandial:   less than 140 mg/dL      Peak postprandial:   less than 180 mg/dL (1-2 hours)      Critically ill patients:  140 - 180 mg/dL   Lab Results  Component Value Date   GLUCAP 149 (H) 01/14/2020   HGBA1C 7.6 (H) 01/13/2020    Review of Glycemic Control Results for Francisco Medina, Francisco Medina (MRN [id number]) as of 01/14/2020 11:14  Ref. Range 01/13/2020 15:51 01/13/2020 19:53 01/14/2020 00:32 01/14/2020 03:59 01/14/2020 10:10  Glucose-Capillary Latest Ref Range: 70 - 99 mg/dL 253 (H) 664 (H) 403 (H) 160 (H) 149 (H)   Diabetes history: DM2 Outpatient Diabetes medications: Glucotrol 5 mg qd + Januvia 100 mg qd Current orders for Inpatient glycemic control: Novolog moderate correction tid + hs 0-5 units  Inpatient Diabetes Program Recommendations:   Currently CBGs okay on Novolog correction. Will follow during hospitalization. Ordered Living Well With Diabetes Book.  Thank you, Billy Fischer. Loriene Taunton, RN, MSN, CDE  Diabetes Coordinator Inpatient Glycemic Control Team Team Pager [phone] (8am-5pm) 01/14/2020 11:25 AM

## 2020-01-15 ENCOUNTER — Other Ambulatory Visit (INDEPENDENT_AMBULATORY_CARE_PROVIDER_SITE_OTHER): Payer: Self-pay | Admitting: Nurse Practitioner

## 2020-01-15 DIAGNOSIS — R059 Cough, unspecified: Secondary | ICD-10-CM

## 2020-01-15 DIAGNOSIS — I7090 Unspecified atherosclerosis: Secondary | ICD-10-CM

## 2020-01-15 DIAGNOSIS — R9431 Abnormal electrocardiogram [ECG] [EKG]: Secondary | ICD-10-CM

## 2020-01-15 DIAGNOSIS — R739 Hyperglycemia, unspecified: Secondary | ICD-10-CM

## 2020-01-15 LAB — BASIC METABOLIC PANEL
Anion gap: 9 (ref 5–15)
BUN: 17 mg/dL (ref 8–23)
CO2: 25 mmol/L (ref 22–32)
Calcium: 8.2 mg/dL — ABNORMAL LOW (ref 8.9–10.3)
Chloride: 103 mmol/L (ref 98–111)
Creatinine, Ser: 1.08 mg/dL (ref 0.61–1.24)
GFR, Estimated: >60 mL/min (ref >60)
Glucose, Bld: 152 mg/dL — ABNORMAL HIGH (ref 70–99)
Potassium: 3.4 mmol/L — ABNORMAL LOW (ref 3.5–5.1)
Sodium: 137 mmol/L (ref 135–145)

## 2020-01-15 LAB — MAGNESIUM: Magnesium: 1.7 mg/dL (ref 1.7–2.4)

## 2020-01-15 LAB — GLUCOSE, CAPILLARY
Glucose-Capillary: 120 mg/dL — ABNORMAL HIGH (ref 70–99)
Glucose-Capillary: 146 mg/dL — ABNORMAL HIGH (ref 70–99)
Glucose-Capillary: 149 mg/dL — ABNORMAL HIGH (ref 70–99)

## 2020-01-15 LAB — PHOSPHORUS: Phosphorus: 4.2 mg/dL (ref 2.5–4.6)

## 2020-01-15 MED ORDER — ASPIRIN EC 81 MG PO TBEC: 81.0000 mg | DELAYED_RELEASE_TABLET | Freq: Every day | ORAL | Status: DC

## 2020-01-15 MED ORDER — OXYCODONE HCL 5 MG PO TABS: 5.0000 mg | ORAL_TABLET | Freq: Four times a day (QID) | ORAL | 0 refills | Status: AC | PRN

## 2020-01-15 MED ORDER — ASPIRIN 325 MG PO TABS
325.0000 mg | ORAL_TABLET | Freq: Every day | ORAL | Status: DC
  Filled 2020-01-15: qty 1

## 2020-01-15 MED ORDER — APIXABAN 5 MG PO TABS
5.0000 mg | ORAL_TABLET | Freq: Two times a day (BID) | ORAL | Status: DC
  Administered 2020-01-15: 5 mg via ORAL
  Filled 2020-01-15: qty 1

## 2020-01-15 MED ORDER — APIXABAN 5 MG PO TABS: 5.0000 mg | ORAL_TABLET | Freq: Two times a day (BID) | ORAL | 11 refills | Status: AC

## 2020-01-15 NOTE — Discharge Instructions (Signed)
You may shower.  Keep your groins clean and dry.

## 2020-01-15 NOTE — Discharge Summary (Signed)
[facility]    Discharge Summary  Patient ID:  Francisco Medina MRN: [id number] DOB/AGE: 1955-08-04 64 y.o.  Admit date: 01/13/2020 Discharge date: 01/15/2020 Date of Surgery: 01/14/2020 Surgeon: Surgeon(s): Wyn Quaker Marlow Baars, MD  Admission Diagnosis: Cough [R05.9] Abnormal ECG [R94.31] Arterial occlusion [I70.90] Hyperglycemia [R73.9] Ischemia of left lower extremity [I99.8]  Discharge Diagnoses:  Cough [R05.9] Abnormal ECG [R94.31] Arterial occlusion [I70.90] Hyperglycemia [R73.9] Ischemia of left lower extremity [I99.8]  Secondary Diagnoses: Past Medical History:  Diagnosis Date  . CHF (congestive heart failure) (HCC)   . Diabetes mellitus without complication (HCC)   . Hypertension    Procedure(s): 01/13/20: 1. Ultrasound guidance for vascular access right femoral artery 2. Catheter placement into left common femoral artery 3. Aortogram and selective left lower extremity angiogram  4.  Penumbra mechanical thrombectomy using a CAT 7 device             5.  Placement of thrombolysis catheter             6.  Placement of right femoral vein central line  01/14/20: 1. Selective left lower extremity angiogram 2. Catheter placement into left posterior tibial artery from right femoral approach 3.  Percutaneous transluminal angioplasty of left posterior tibial artery with 2.5 mm diameter by 30 cm length angioplasty balloon 4. Percutaneous transluminal angioplasty of left SFA and popliteal arteries with 2 inflations with a 5 mm diameter by 30 cm length Lutonix drug-coated angioplasty balloon 5.  Mechanical thrombectomy with the penumbra cat 6 device to the left SFA and popliteal arteries             6. StarClose closure device right femoral artery  Discharged Condition: Good  HPI / Hospital Course:  Patient is a 64 y.o.male with history  of bilateral lower extremity PAD with stenting.  Patient presented with acute ischemia of the left lower extremity.  The patient has noninvasive study showing complete occlusion of the left SFA and popliteal arteries including the SFA stent. The patient is brought in for angiography for further evaluation and potential treatment.  Due to the limb threatening nature of the situation, angiogram was performed for attempted limb salvage. The patient is aware that if the procedure fails, amputation would be expected.  On January 13, 2020: The patient underwent:  1. Ultrasound guidance for vascular access right femoral artery 2. Catheter placement into left common femoral artery 3. Aortogram and selective left lower extremity angiogram 4.  Penumbra mechanical thrombectomy using a CAT 7 device             5.  Placement of thrombolysis catheter             6.  Placement of right femoral vein central line  The patient tolerated the procedure and was transferred to the ICU for observation and infusion of TPA overnight.  The following morning the patient was taken back to the angiography suite and underwent:  1. Selective left lower extremity angiogram 2. Catheter placement into left posterior tibial artery from right femoral approach 3.  Percutaneous transluminal angioplasty of left posterior tibial artery with 2.5 mm diameter by 30 cm length angioplasty balloon 4. Percutaneous transluminal angioplasty of left SFA and popliteal arteries with 2 inflations with a 5 mm diameter by 30 cm length Lutonix drug-coated angioplasty balloon 5.  Mechanical thrombectomy with the penumbra cat 6 device to the left SFA and popliteal arteries             6.  StarClose closure device right femoral artery  Again, the patient tolerated the procedure was transferred back to the ICU for infusion of Aggrastat  overnight.  No issues night of either procedures.  Postop day 2, the patient was transitioned from Aggrastat to aspirin and Eliquis.  During his inpatient stay, his diet was advanced, his Foley was removed and he was urinating on his own, his pain was controlled to the use of p.o. pain medication and he was ambulating at baseline.  Day of discharge, the patient was afebrile stable vital signs and improved physical exam.  Physical exam:  Alert and oriented x3 Cardiovascular: Regular rate and rhythm Pulmonary: Clear to auscultation bilaterally Abdomen: Soft, non-tender, non-distended Access site: Clean dry and intact Extremity:   Left: Thigh warm.  Cap formation resume distally toes.  Dopplerable pedal pulses.  Good capillary refill.  Motor/sensory is intact.  No acute vascular compromise/compartment syndrome noted at this time.  Labs: As below  Complications: None  Consults: None  Significant Diagnostic Studies: CBC Lab Results  Component Value Date   WBC 8.1 01/14/2020   HGB 12.0 (L) 01/14/2020   HCT 36.3 (L) 01/14/2020   MCV 87.3 01/14/2020   PLT 169 01/14/2020   BMET    Component Value Date/Time   NA 137 01/15/2020 0602   NA 136 03/28/2013 1542   K 3.4 (L) 01/15/2020 0602   K 4.1 03/28/2013 1542   CL 103 01/15/2020 0602   CL 102 03/28/2013 1542   CO2 25 01/15/2020 0602   CO2 28 03/28/2013 1542   GLUCOSE 152 (H) 01/15/2020 0602   GLUCOSE 88 03/28/2013 1542   BUN 17 01/15/2020 0602   BUN 36 (H) 03/28/2013 1542   CREATININE 1.08 01/15/2020 0602   CREATININE 1.39 (H) 03/28/2013 1542   CALCIUM 8.2 (L) 01/15/2020 0602   CALCIUM 9.4 03/28/2013 1542   GFRNONAA >60 01/15/2020 0602   GFRNONAA 55 (L) 03/28/2013 1542   GFRAA >60 07/30/2019 0815   GFRAA >60 03/28/2013 1542   COAG Lab Results  Component Value Date   INR 1.0 01/13/2020   Disposition:  Discharge to :Home  Allergies as of 01/15/2020      Reactions   Atorvastatin Other (See Comments)   Other reaction(s):  Muscle Pain      Medication List    STOP taking these medications   clopidogrel 75 MG tablet Commonly known as: PLAVIX     TAKE these medications   acetaminophen 325 MG tablet Commonly known as: TYLENOL Take 325 mg by mouth every 6 (six) hours as needed.   apixaban 5 MG Tabs tablet Commonly known as: ELIQUIS Take 1 tablet (5 mg total) by mouth 2 (two) times daily.   aspirin 325 MG tablet Take 325 mg by mouth at bedtime.   carvedilol 6.25 MG tablet Commonly known as: COREG Take 6.25 mg by mouth 2 (two) times daily.   colchicine 0.6 MG tablet Take 0.6 mg by mouth daily as needed (gout).   DSS 100 MG Caps Take 100 mg by mouth at bedtime.   fluticasone 50 MCG/ACT nasal spray Commonly known as: FLONASE Place 2 sprays into both nostrils daily as needed for allergies.   furosemide 40 MG tablet Commonly known as: LASIX Take 40 mg by mouth daily.   glipiZIDE 5 MG 24 hr tablet Commonly known as: GLUCOTROL XL Take 5 mg by mouth daily.   isosorbide mononitrate 30 MG 24 hr tablet Commonly known as: IMDUR Take 30 mg by mouth daily.  Januvia 100 MG tablet Generic drug: sitaGLIPtin Take 100 mg by mouth at bedtime.   latanoprost 0.005 % ophthalmic solution Commonly known as: XALATAN Place 1 drop into both eyes at bedtime.   losartan 100 MG tablet Commonly known as: COZAAR Take 100 mg by mouth daily.   metFORMIN 1000 MG tablet Commonly known as: GLUCOPHAGE Take 1,000 mg by mouth 2 (two) times daily with a meal.   nitroGLYCERIN 0.4 MG SL tablet Commonly known as: NITROSTAT Place 0.4 mg under the tongue every 5 (five) minutes as needed for chest pain.   nitroGLYCERIN 2.5 MG CR capsule Take 2.5 mg by mouth daily.   oxyCODONE 5 MG immediate release tablet Commonly known as: Oxy IR/ROXICODONE Take 1 tablet (5 mg total) by mouth every 6 (six) hours as needed for moderate pain or severe pain.   potassium chloride 10 MEQ tablet Commonly known as: KLOR-CON Take 10  mEq by mouth daily.   rosuvastatin 20 MG tablet Commonly known as: CRESTOR Take 20 mg by mouth at bedtime.   timolol 0.5 % ophthalmic solution Commonly known as: TIMOPTIC Place 1 drop into both eyes every morning.      Verbal and written Discharge instructions given to the patient. Wound care per Discharge AVS  Follow-up Information    Dew, Marlow Baars, MD Follow up in 2 week(s).   Specialties: Vascular Surgery, Radiology, Interventional Cardiology Why: Can see Dew or Vivia [place].  Patient will need ABI with visit. Contact information: [address] [place] [address] [phone]              Signed: Tonette Lederer, PA-C  01/15/2020, 10:43 AM

## 2020-01-16 NOTE — Telephone Encounter (Signed)
How may refills of this medication can this pt have?

## 2020-01-21 ENCOUNTER — Encounter: Payer: Self-pay | Admitting: Vascular Surgery

## 2020-01-23 ENCOUNTER — Other Ambulatory Visit (INDEPENDENT_AMBULATORY_CARE_PROVIDER_SITE_OTHER): Payer: Self-pay | Admitting: Nurse Practitioner

## 2020-01-25 ENCOUNTER — Other Ambulatory Visit (INDEPENDENT_AMBULATORY_CARE_PROVIDER_SITE_OTHER): Payer: Self-pay | Admitting: Vascular Surgery

## 2020-01-25 DIAGNOSIS — I70222 Atherosclerosis of native arteries of extremities with rest pain, left leg: Secondary | ICD-10-CM

## 2020-01-25 DIAGNOSIS — Z9862 Peripheral vascular angioplasty status: Secondary | ICD-10-CM

## 2020-01-30 ENCOUNTER — Ambulatory Visit (INDEPENDENT_AMBULATORY_CARE_PROVIDER_SITE_OTHER): Payer: Commercial Managed Care - PPO

## 2020-01-30 ENCOUNTER — Encounter (INDEPENDENT_AMBULATORY_CARE_PROVIDER_SITE_OTHER): Payer: Self-pay | Admitting: Nurse Practitioner

## 2020-01-30 ENCOUNTER — Other Ambulatory Visit: Payer: Self-pay

## 2020-01-30 ENCOUNTER — Ambulatory Visit (INDEPENDENT_AMBULATORY_CARE_PROVIDER_SITE_OTHER): Payer: Commercial Managed Care - PPO | Admitting: Nurse Practitioner

## 2020-01-30 VITALS — BP 98/64 | HR 89 | Resp 16 | Wt 175.0 lb

## 2020-01-30 DIAGNOSIS — I10 Essential (primary) hypertension: Secondary | ICD-10-CM | POA: Diagnosis not present

## 2020-01-30 DIAGNOSIS — E785 Hyperlipidemia, unspecified: Secondary | ICD-10-CM

## 2020-01-30 DIAGNOSIS — Z9862 Peripheral vascular angioplasty status: Secondary | ICD-10-CM | POA: Diagnosis not present

## 2020-01-30 DIAGNOSIS — E119 Type 2 diabetes mellitus without complications: Secondary | ICD-10-CM

## 2020-01-30 DIAGNOSIS — I70222 Atherosclerosis of native arteries of extremities with rest pain, left leg: Secondary | ICD-10-CM

## 2020-02-05 ENCOUNTER — Encounter (INDEPENDENT_AMBULATORY_CARE_PROVIDER_SITE_OTHER): Payer: Self-pay | Admitting: Nurse Practitioner

## 2020-02-05 NOTE — Progress Notes (Signed)
Subjective:    Patient ID: Francisco Medina, male    DOB: Jan 15, 1956, 64 y.o.   MRN: [id number] Chief Complaint  Patient presents with  . Follow-up    [facility] 2wk abi    The patient returns to the office for followup and review status post angiogram with intervention.  The patient presented to [facility] on 01/13/2020 with an ischemic left leg.  The patient underwent thrombectomy the following day.  The patient notes improvement with the claudication pain however he notes that he has severe numbness and tingling as well as tenderness when the foot is touched.  Patient has no open wounds or ulcerations.  He also notes that his foot feels cool.    No new ulcers or wounds have occurred since the last visit.  There have been no significant changes to the patient's overall health care.  The patient denies amaurosis fugax or recent TIA symptoms. There are no recent neurological changes noted. The patient denies history of DVT, PE or superficial thrombophlebitis. The patient denies recent episodes of angina or shortness of breath.   ABI's Rt=1.04 and Lt=0.96  (previous ABI's Rt=1.14 and Lt=1.10) Duplex US of the right tibial arteries reveals triphasic waveforms however with dampened toe waveforms.  The left lower extremity has an occluded anterior tibial artery.  The posterior tibial of the left lower extremity has strong biphasic waveforms with monophasic waveforms of the peroneal artery.  The patient has slightly diminished toe waveforms on the left.   Review of Systems  Neurological: Positive for numbness.  All other systems reviewed and are negative.      Objective:   Physical Exam Vitals reviewed.  HENT:     Head: Normocephalic.  Cardiovascular:     Rate and Rhythm: Normal rate and regular rhythm.     Pulses:          Dorsalis pedis pulses are 0 on the left side.       Posterior tibial pulses are 1+ on the left side.  Pulmonary:     Effort: Pulmonary  effort is normal.  Musculoskeletal:        General: Tenderness present.     Left lower leg: Edema present.  Skin:    General: Skin is dry.  Neurological:     Mental Status: He is alert and oriented to person, place, and time.  Psychiatric:        Mood and Affect: Mood normal.        Behavior: Behavior normal.        Thought Content: Thought content normal.        Judgment: Judgment normal.     BP 98/64 (BP Location: Right Arm)   Pulse 89   Resp 16   Wt 175 lb (79.4 kg)   BMI 26.61 kg/m   Past Medical History:  Diagnosis Date  . CHF (congestive heart failure) (HCC)   . Diabetes mellitus without complication (HCC)   . Hypertension     Social History   Socioeconomic History  . Marital status: Married    Spouse name: Not on file  . Number of children: Not on file  . Years of education: Not on file  . Highest education level: Not on file  Occupational History  . Not on file  Tobacco Use  . Smoking status: Former Smoker    Packs/day: 1.00    Years: 10.00    Pack years: 10.00  . Smokeless tobacco: Never Used  Vaping Use  .  Vaping Use: Never used  Substance and Sexual Activity  . Alcohol use: Not Currently  . Drug use: Not Currently  . Sexual activity: Yes  Other Topics Concern  . Not on file  Social History Narrative  . Not on file   Social Determinants of Health   Financial Resource Strain:   . Difficulty of Paying Living Expenses: Not on file  Food Insecurity:   . Worried About Programme researcher, broadcasting/film/video in the Last Year: Not on file  . Ran Out of Food in the Last Year: Not on file  Transportation Needs:   . Lack of Transportation (Medical): Not on file  . Lack of Transportation (Non-Medical): Not on file  Physical Activity:   . Days of Exercise per Week: Not on file  . Minutes of Exercise per Session: Not on file  Stress:   . Feeling of Stress : Not on file  Social Connections:   . Frequency of Communication with Friends and Family: Not on file  .  Frequency of Social Gatherings with Friends and Family: Not on file  . Attends Religious Services: Not on file  . Active Member of Clubs or Organizations: Not on file  . Attends Banker Meetings: Not on file  . Marital Status: Not on file  Intimate Partner Violence:   . Fear of Current or Ex-Partner: Not on file  . Emotionally Abused: Not on file  . Physically Abused: Not on file  . Sexually Abused: Not on file    Past Surgical History:  Procedure Laterality Date  . APPENDECTOMY    . CORONARY ARTERY BYPASS GRAFT    . LEFT HEART CATH AND CORS/GRAFTS ANGIOGRAPHY N/A 04/17/2018   Procedure: LEFT HEART CATH AND CORS/GRAFTS ANGIOGRAPHY;  Surgeon: Dalia Heading, MD;  Location: [facility] INVASIVE CV LAB;  Service: Cardiovascular;  Laterality: N/A;  . LOWER EXTREMITY ANGIOGRAPHY Right 12/11/2018   Procedure: LOWER EXTREMITY ANGIOGRAPHY;  Surgeon: Annice Needy, MD;  Location: [facility] INVASIVE CV [facility];  Service: Cardiovascular;  Laterality: Right;  . LOWER EXTREMITY ANGIOGRAPHY Left 12/18/2018   Procedure: LOWER EXTREMITY ANGIOGRAPHY;  Surgeon: Annice Needy, MD;  Location: [facility] CV [facility];  Service: Cardiovascular;  Laterality: Left;  . LOWER EXTREMITY ANGIOGRAPHY Left 07/23/2019   Procedure: LOWER EXTREMITY ANGIOGRAPHY;  Surgeon: Annice Needy, MD;  Location: [facility] CV [facility];  Service: Cardiovascular;  Laterality: Left;  . LOWER EXTREMITY ANGIOGRAPHY Right 07/30/2019   Procedure: LOWER EXTREMITY ANGIOGRAPHY;  Surgeon: Annice Needy, MD;  Location: [facility];  Service: Cardiovascular;  Laterality: Right;  . LOWER EXTREMITY ANGIOGRAPHY Left 01/14/2020   Procedure: Lower Extremity Angiography;  Surgeon: Annice Needy, MD;  Location: [facility];  Service: Cardiovascular;  Laterality: Left;  . LOWER EXTREMITY ANGIOGRAPHY Left 01/13/2020   Procedure: Lower Extremity Angiography; thrombectomy; thrombolysis;  Surgeon: Bertram Denver, MD;  Location: [facility] CV [facility];   Service: Cardiovascular;  Laterality: Left;    Family History  Family history unknown: Yes    Allergies  Allergen Reactions  . Atorvastatin Other (See Comments)    Other reaction(s): Muscle Pain    CBC Latest Ref Rng & Units 01/14/2020 01/14/2020 01/13/2020  WBC 4.0 - 10.5 K/uL 8.1 6.7 7.4  Hemoglobin 13.0 - 17.0 g/dL 12.0(L) 11.9(L) 11.5(L)  Hematocrit 39 - 52 % 36.3(L) 36.1(L) 35.2(L)  Platelets 150 - 400 K/uL 169 154 150      CMP     Component Value Date/Time  NA 137 01/15/2020 0602   NA 136 03/28/2013 1542   K 3.4 (L) 01/15/2020 0602   K 4.1 03/28/2013 1542   CL 103 01/15/2020 0602   CL 102 03/28/2013 1542   CO2 25 01/15/2020 0602   CO2 28 03/28/2013 1542   GLUCOSE 152 (H) 01/15/2020 0602   GLUCOSE 88 03/28/2013 1542   BUN 17 01/15/2020 0602   BUN 36 (H) 03/28/2013 1542   CREATININE 1.08 01/15/2020 0602   CREATININE 1.39 (H) 03/28/2013 1542   CALCIUM 8.2 (L) 01/15/2020 0602   CALCIUM 9.4 03/28/2013 1542   PROT 7.6 01/13/2020 0849   ALBUMIN 4.4 01/13/2020 0849   AST 18 01/13/2020 0849   ALT 18 01/13/2020 0849   ALKPHOS 70 01/13/2020 0849   BILITOT 1.1 01/13/2020 0849   GFRNONAA >60 01/15/2020 0602   GFRNONAA 55 (L) 03/28/2013 1542   GFRAA >60 07/30/2019 0815   GFRAA >60 03/28/2013 1542     VAS [place] ABI WITH/WO TBI  Result Date: 02/05/2020 LOWER EXTREMITY DOPPLER STUDY Indications: Peripheral artery disease.  Vascular Interventions: 12/11/18: Right SFA/popliteal stent with right CIA, TP                         trunk & posterior tibial PTAs;                         12/18/18: Left CIA & SFA stents with left popliteal, TP                         trunk & peroneal PTAs;                         07/23/19: Left SFA/popliteal stent with TP trunk &                         posterior tibial PTAs;                         07/30/19: Right SFA/popliteal thrombectomies/PTAs with                         right popliteal stent;                          01/13/2020: Aortogram and  Selective Left Lower Extremity                         Angiogram. Penumbra mechanical thrombectomy using a CAT                         7 device. Placement of Thrombolysis. Placement of Right                         Femoral Vein Central line.                          01/14/2020: PTA of Left Posterior Tibial Artery. PTA of                         the Left SFA and Popliteal Artery. Mechanical  Thrombectomy with the penumbra CAT 6 device to the Left                         SFA and Popliteal Artery. Comparison Study: 11/27/2019 Performing Technologist: Debbe BalesSolomon Mcclary RVS  Examination Guidelines: A complete evaluation includes at minimum, Doppler waveform signals and systolic blood pressure reading at the level of bilateral brachial, anterior tibial, and posterior tibial arteries, when vessel segments are accessible. Bilateral testing is considered an integral part of a complete examination. Photoelectric Plethysmograph (PPG) waveforms and toe systolic pressure readings are included as required and additional duplex testing as needed. Limited examinations for reoccurring indications may be performed as noted.  ABI Findings: +---------+------------------+-----+---------+--------+ Right    Rt Pressure (mmHg)IndexWaveform Comment  +---------+------------------+-----+---------+--------+ Brachial 139                                      +---------+------------------+-----+---------+--------+ ATA      143               1.03 triphasic         +---------+------------------+-----+---------+--------+ PTA      144               1.04 triphasic         +---------+------------------+-----+---------+--------+ Great Toe84                0.60 Abnormal          +---------+------------------+-----+---------+--------+ +---------+------------------+-----+--------+-------+ Left     Lt Pressure (mmHg)IndexWaveformComment +---------+------------------+-----+--------+-------+ Brachial  135                                    +---------+------------------+-----+--------+-------+ PTA      134               0.96 biphasic        +---------+------------------+-----+--------+-------+ PERO     134               0.96 biphasic        +---------+------------------+-----+--------+-------+ Albin FellingGreat Toe87                0.63 Abnormal        +---------+------------------+-----+--------+-------+ +-------+-----------+-----------+------------+------------+ ABI/TBIToday's ABIToday's TBIPrevious ABIPrevious TBI +-------+-----------+-----------+------------+------------+ Right  1.04       .60        1.14        .80          +-------+-----------+-----------+------------+------------+ Left   96         .63        1.10        .79          +-------+-----------+-----------+------------+------------+ Bilateral TBIs appear decreased compared to prior study on 11/27/2019. Left ABIs appear decreased compared to prior study on 11/27/2019.  Summary: Right: Resting right ankle-brachial index is within normal range. No evidence of significant right lower extremity arterial disease. The right toe-brachial index is abnormal. Left: Resting left ankle-brachial index is within normal range. No evidence of significant left lower extremity arterial disease. The left toe-brachial index is abnormal. Imaged the Left PTA,ATA and Peroneal Artery; no flow seen in the Left ATA.  *See table(s) above for measurements and observations.  Electronically signed by Festus BarrenJason Dew MD on 02/05/2020 at 9:35:48 AM.    Final  Assessment & Plan:   1. Hyperlipidemia, unspecified hyperlipidemia type Continue statin as ordered and reviewed, no changes at this time   2. Atherosclerosis of native artery of left leg with rest pain (HCC) The patient has continued numbness and discomfort in the left lower extremity following intervention.  I discussion with the patient and wife in regards to the fact that the  patient's left lower extremity had ischemia for extended period of time, the numbness and tingling will is likely a result of nerve damage.  Because this is relatively recent it is hard to determine if this will be permanent or if it may decrease over time.  In regards to invasive intervention, angiogram was discussed however it was discussed that even with intervention it would likely not stop the numbness and tingling.  Patient and wife wish to think about which way to proceed they will contact our office with their decision.  3. Benign essential hypertension Continue antihypertensive medications as already ordered, these medications have been reviewed and there are no changes at this time.   4. Type 2 diabetes mellitus without complication, unspecified whether long term insulin use (HCC) Continue hypoglycemic medications as already ordered, these medications have been reviewed and there are no changes at this time.  Hgb A1C to be monitored as already arranged by primary service    Current Outpatient Medications on File Prior to Visit  Medication Sig Dispense Refill  . acetaminophen (TYLENOL) 325 MG tablet Take 325 mg by mouth every 6 (six) hours as needed.     Marland Kitchen apixaban (ELIQUIS) 5 MG TABS tablet Take 1 tablet (5 mg total) by mouth 2 (two) times daily. 60 tablet 11  . Aspirin 81 MG CAPS Take 81 mg by mouth at bedtime.     . carvedilol (COREG) 6.25 MG tablet Take 6.25 mg by mouth 2 (two) times daily.    . colchicine 0.6 MG tablet Take 0.6 mg by mouth daily as needed (gout).     Tery Sanfilippo Sodium (DSS) 100 MG CAPS Take 100 mg by mouth at bedtime.     . fluticasone (FLONASE) 50 MCG/ACT nasal spray Place 2 sprays into both nostrils daily as needed for allergies.    . furosemide (LASIX) 40 MG tablet Take 40 mg by mouth daily.    Marland Kitchen glipiZIDE (GLUCOTROL XL) 5 MG 24 hr tablet Take 5 mg by mouth daily.    . isosorbide mononitrate (IMDUR) 30 MG 24 hr tablet Take 30 mg by mouth daily.    Marland Kitchen JANUVIA  100 MG tablet Take 100 mg by mouth at bedtime.     Marland Kitchen latanoprost (XALATAN) 0.005 % ophthalmic solution Place 1 drop into both eyes at bedtime.    Marland Kitchen losartan (COZAAR) 100 MG tablet Take 100 mg by mouth daily.    . metFORMIN (GLUCOPHAGE) 1000 MG tablet Take 1,000 mg by mouth 2 (two) times daily with a meal.    . nitroGLYCERIN 2.5 MG CR capsule Take 2.5 mg by mouth daily.     . potassium chloride (K-DUR) 10 MEQ tablet Take 10 mEq by mouth daily.    . timolol (TIMOPTIC) 0.5 % ophthalmic solution Place 1 drop into both eyes every morning.    . nitroGLYCERIN (NITROSTAT) 0.4 MG SL tablet Place 0.4 mg under the tongue every 5 (five) minutes as needed for chest pain.     Marland Kitchen oxyCODONE (OXY IR/ROXICODONE) 5 MG immediate release tablet Take 1 tablet (5 mg total) by mouth every 6 (six) hours as  needed for moderate pain or severe pain. (Patient not taking: Reported on 01/30/2020) 15 tablet 0  . rosuvastatin (CRESTOR) 20 MG tablet Take 20 mg by mouth at bedtime.      No current facility-administered medications on file prior to visit.    There are no Patient Instructions on file for this visit. No follow-ups on file.   Georgiana Spinner, NP

## 2020-02-12 ENCOUNTER — Encounter (INDEPENDENT_AMBULATORY_CARE_PROVIDER_SITE_OTHER): Payer: Self-pay

## 2020-02-12 ENCOUNTER — Encounter (INDEPENDENT_AMBULATORY_CARE_PROVIDER_SITE_OTHER): Payer: Self-pay | Admitting: Vascular Surgery

## 2020-02-12 ENCOUNTER — Other Ambulatory Visit: Payer: Self-pay

## 2020-02-12 ENCOUNTER — Encounter (INDEPENDENT_AMBULATORY_CARE_PROVIDER_SITE_OTHER): Payer: Commercial Managed Care - PPO | Admitting: Vascular Surgery

## 2020-02-21 ENCOUNTER — Other Ambulatory Visit (INDEPENDENT_AMBULATORY_CARE_PROVIDER_SITE_OTHER): Payer: Self-pay | Admitting: Nurse Practitioner

## 2020-03-04 ENCOUNTER — Encounter (INDEPENDENT_AMBULATORY_CARE_PROVIDER_SITE_OTHER): Payer: Commercial Managed Care - PPO

## 2020-03-04 ENCOUNTER — Ambulatory Visit (INDEPENDENT_AMBULATORY_CARE_PROVIDER_SITE_OTHER): Payer: Commercial Managed Care - PPO | Admitting: Vascular Surgery

## 2020-03-11 ENCOUNTER — Ambulatory Visit (INDEPENDENT_AMBULATORY_CARE_PROVIDER_SITE_OTHER): Payer: Commercial Managed Care - PPO | Admitting: Vascular Surgery

## 2020-04-20 ENCOUNTER — Other Ambulatory Visit (INDEPENDENT_AMBULATORY_CARE_PROVIDER_SITE_OTHER): Payer: Self-pay | Admitting: Nurse Practitioner

## 2020-05-05 ENCOUNTER — Other Ambulatory Visit (INDEPENDENT_AMBULATORY_CARE_PROVIDER_SITE_OTHER): Payer: Self-pay | Admitting: Nurse Practitioner

## 2020-05-05 DIAGNOSIS — I739 Peripheral vascular disease, unspecified: Secondary | ICD-10-CM

## 2020-05-05 DIAGNOSIS — Z95820 Peripheral vascular angioplasty status with implants and grafts: Secondary | ICD-10-CM

## 2020-05-06 ENCOUNTER — Encounter (INDEPENDENT_AMBULATORY_CARE_PROVIDER_SITE_OTHER): Payer: Commercial Managed Care - PPO

## 2020-05-06 ENCOUNTER — Ambulatory Visit (INDEPENDENT_AMBULATORY_CARE_PROVIDER_SITE_OTHER): Payer: Commercial Managed Care - PPO | Admitting: Vascular Surgery

## 2021-03-25 ENCOUNTER — Telehealth: Payer: Self-pay | Admitting: Acute Care

## 2021-03-25 NOTE — Telephone Encounter (Signed)
Left message for pt to call to schedule f/u lung screening CT.  °

## 2021-05-12 ENCOUNTER — Other Ambulatory Visit: Payer: Self-pay | Admitting: Neurology

## 2021-05-12 DIAGNOSIS — G250 Essential tremor: Secondary | ICD-10-CM

## 2021-05-19 ENCOUNTER — Ambulatory Visit: Payer: Medicare Other

## 2021-05-21 ENCOUNTER — Ambulatory Visit
Admission: RE | Admit: 2021-05-21 | Discharge: 2021-05-21 | Disposition: A | Discharge status: Home / Self Care | Payer: Medicare Other | Source: Ambulatory Visit | Attending: Neurology | Admitting: Neurology

## 2021-05-21 ENCOUNTER — Other Ambulatory Visit: Payer: Self-pay

## 2021-05-21 DIAGNOSIS — G250 Essential tremor: Secondary | ICD-10-CM | POA: Diagnosis present

## 2021-12-15 ENCOUNTER — Telehealth: Payer: Self-pay | Admitting: *Deleted

## 2021-12-15 NOTE — Telephone Encounter (Signed)
Spoke with pt. About scheduling LCS CT scan follow up, he requested that he would call back later for this. Provided him with call back number and discussed importance of follow up.

## 2023-12-26 IMAGING — CT CT HEAD W/O CM
1 series · 15 of 30 positions shown, 19 images · non-contrast
Comparison: Head CT 07/24/2016.

CLINICAL DATA: 65-year-old male with persistent upper extremity
tremor. No known injury.



[Series 2: head wo · axial · 0.40mm/px · z∈[-106,+29]mm · 15 of 31 slices shown, 19 images]
[im 2/31  brain]
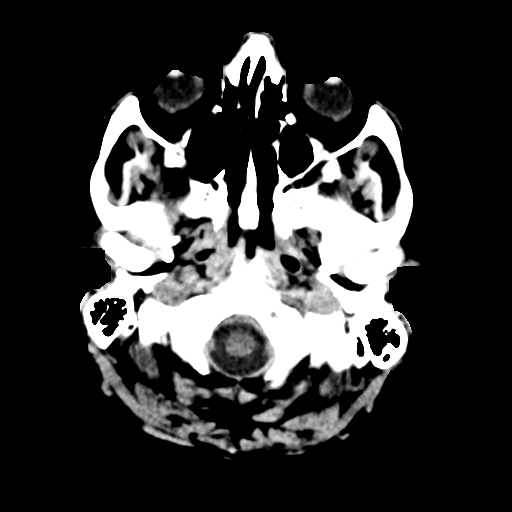
[im 2/31  bone]
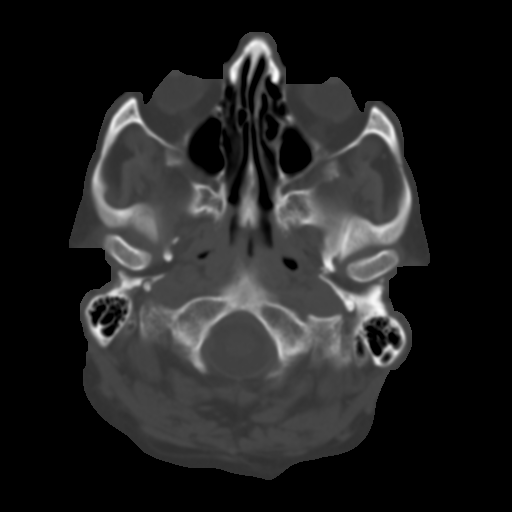
[im 4/31  brain]
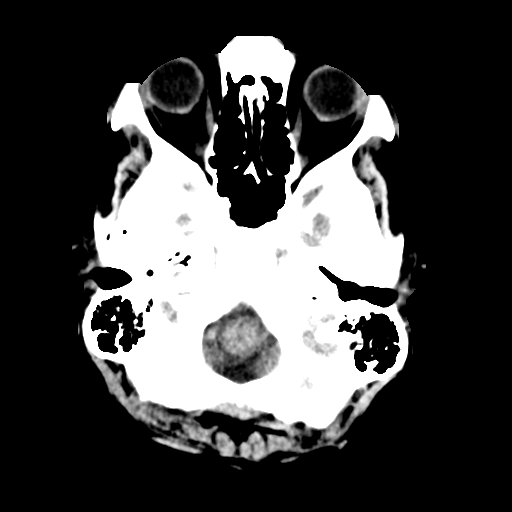
[im 6/31  brain]
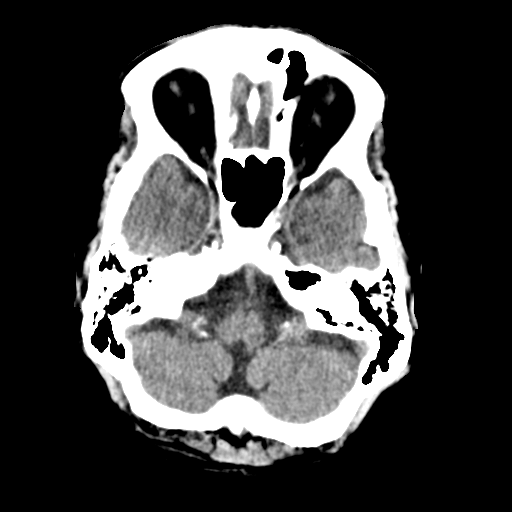
[im 8/31  brain]
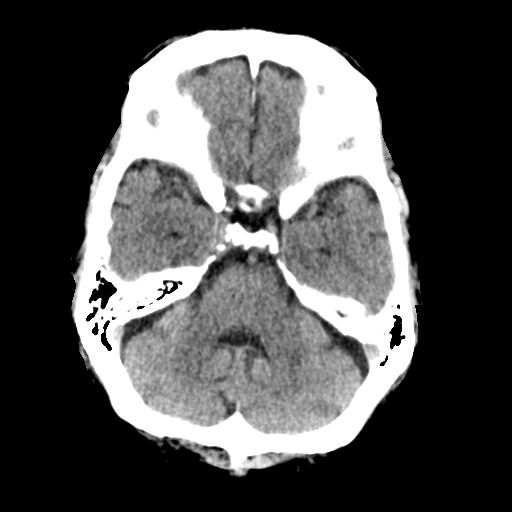
[im 10/31  brain]
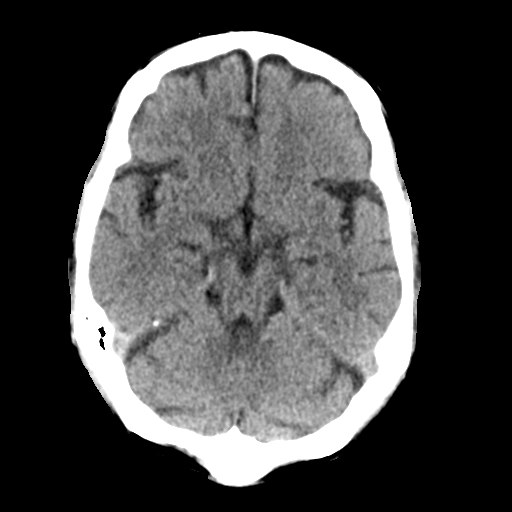
[im 10/31  bone]
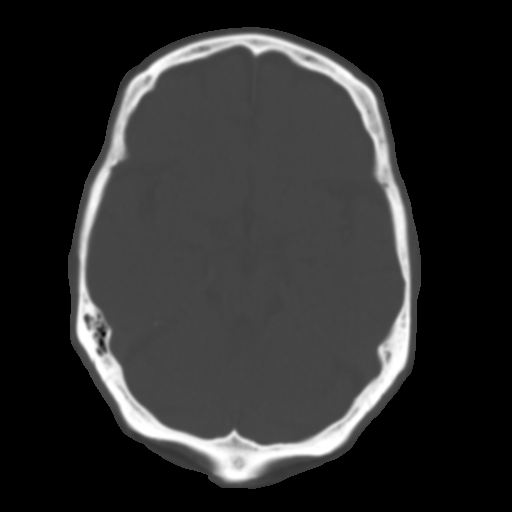
[im 12/31  brain]
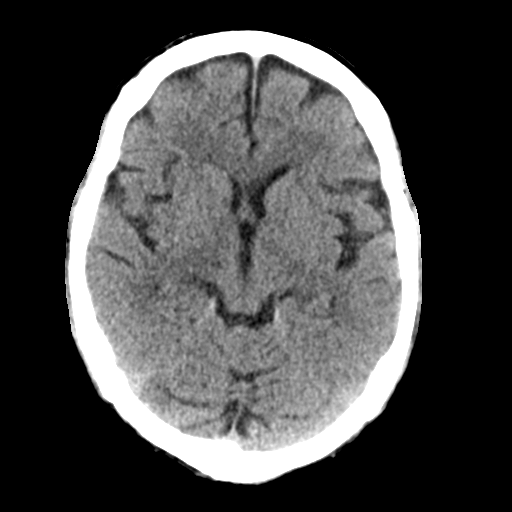
[im 14/31  brain]
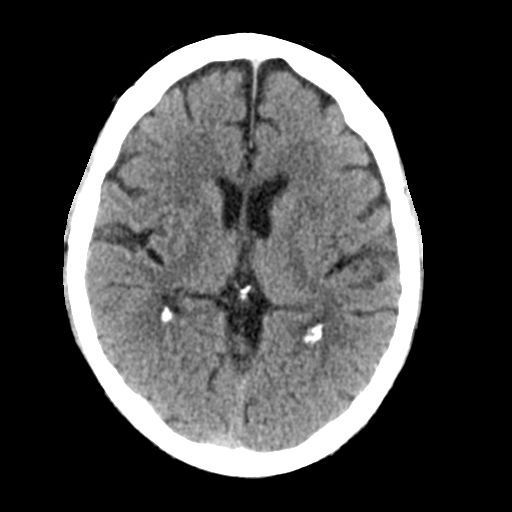
[im 16/31  brain]
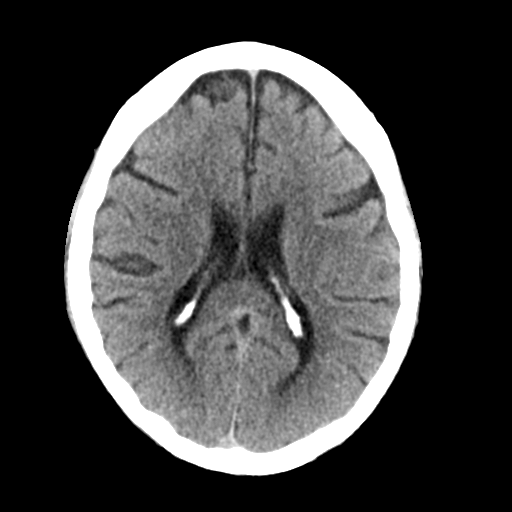
[im 17/31  brain]
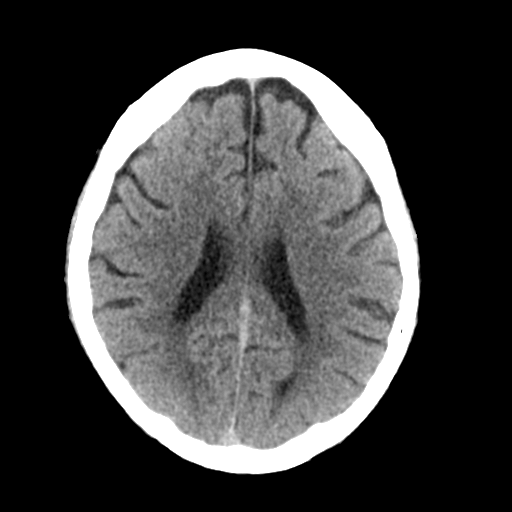
[im 17/31  bone]
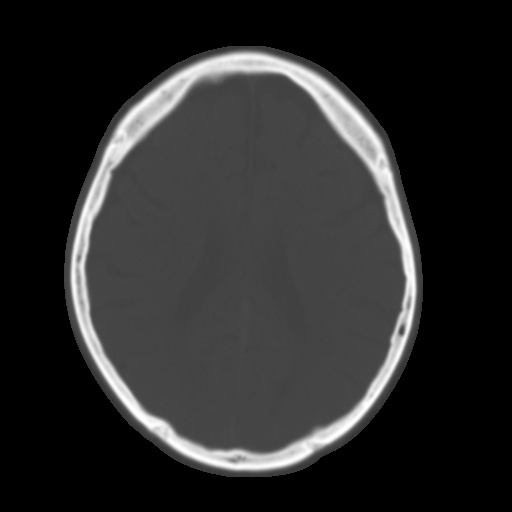
[im 19/31  brain]
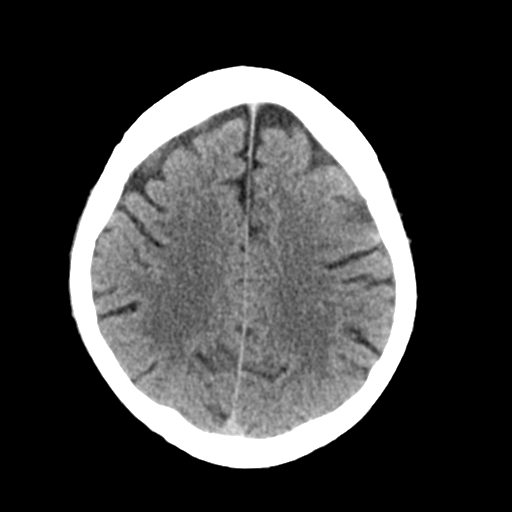
[im 21/31  brain]
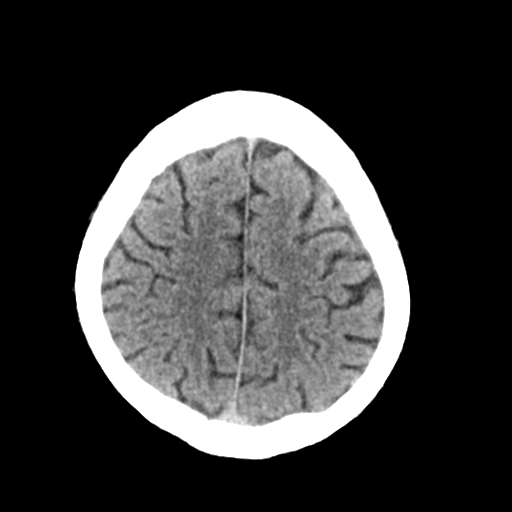
[im 23/31  brain]
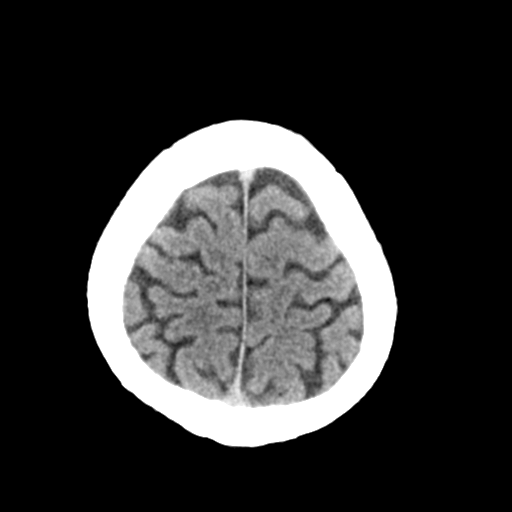
[im 25/31  brain]
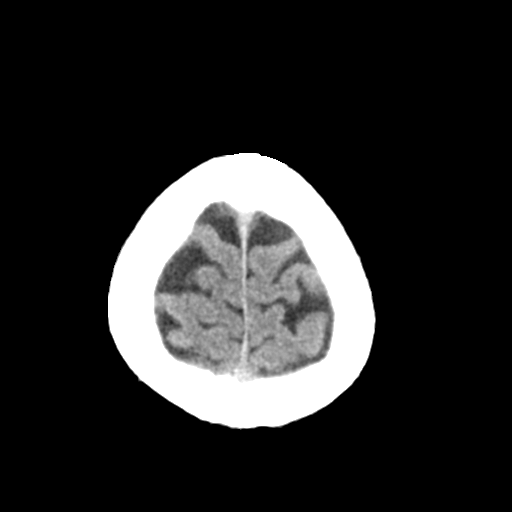
[im 25/31  bone]
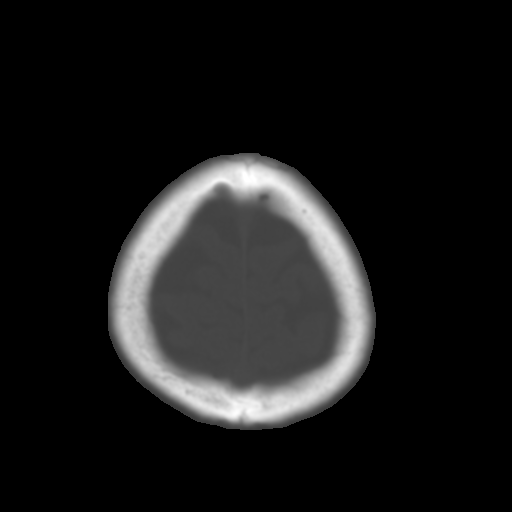
[im 27/31  brain]
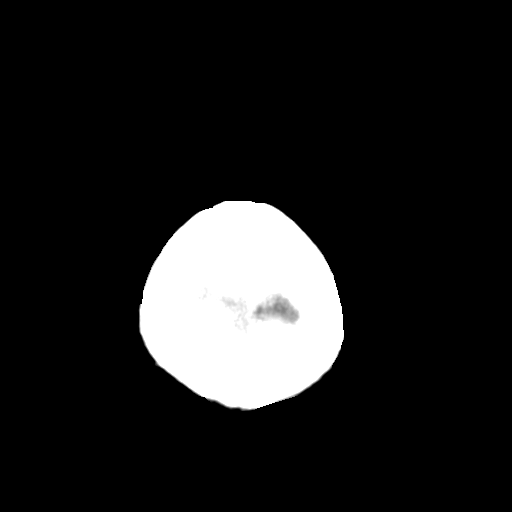
[im 29/31  brain]
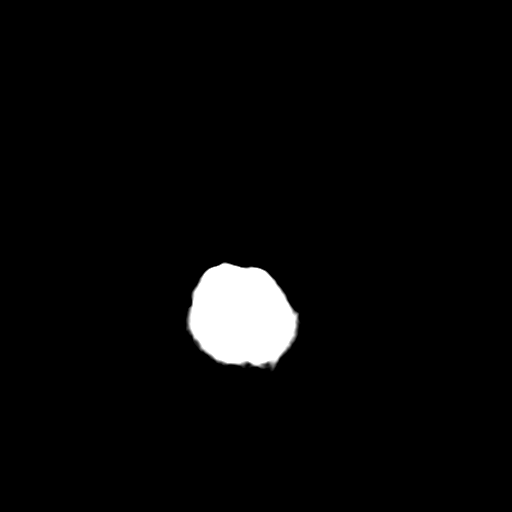

[15 of 30 positions shown; findings below may reference images not displayed]

FINDINGS: Brain: Cerebral volume is stable since 4447 and within normal limits
for age. No midline shift, ventriculomegaly, mass effect, evidence
of mass lesion, intracranial hemorrhage or evidence of cortically
based acute infarction. Minimal to mild nonspecific cerebral white
matter hypodensity for age. Gray-white matter differentiation
otherwise stable and within normal limits.

Vascular: Calcified atherosclerosis at the skull base. No suspicious
intracranial vascular hyperdensity.

Skull: No acute osseous abnormality identified.

Sinuses/Orbits: Visualized paranasal sinuses and mastoids are clear.

Other: Visualized orbits and scalp soft tissues are within normal
limits.
IMPRESSION: No acute intracranial abnormality and largely normal for age non
contrast CT appearance of the brain.
